# Patient Record
Sex: Female | Born: 1975 | Race: White | Hispanic: No | Smoking: Former smoker
Health system: Southern US, Community
[De-identification: ages and names within clinical notes are randomized; demographics above are authoritative.]

## PROBLEM LIST (undated history)

## (undated) DIAGNOSIS — N83209 Unspecified ovarian cyst, unspecified side: Secondary | ICD-10-CM

## (undated) DIAGNOSIS — S82409A Unspecified fracture of shaft of unspecified fibula, initial encounter for closed fracture: Secondary | ICD-10-CM

## (undated) DIAGNOSIS — R740 Nonspecific elevation of levels of transaminase and lactic acid dehydrogenase [LDH]: Secondary | ICD-10-CM

## (undated) DIAGNOSIS — I10 Essential (primary) hypertension: Secondary | ICD-10-CM

## (undated) DIAGNOSIS — F419 Anxiety disorder, unspecified: Secondary | ICD-10-CM

## (undated) DIAGNOSIS — Z720 Tobacco use: Secondary | ICD-10-CM

## (undated) DIAGNOSIS — F1910 Other psychoactive substance abuse, uncomplicated: Secondary | ICD-10-CM

## (undated) DIAGNOSIS — S060XAA Concussion with loss of consciousness status unknown, initial encounter: Secondary | ICD-10-CM

## (undated) DIAGNOSIS — S060X9A Concussion with loss of consciousness of unspecified duration, initial encounter: Secondary | ICD-10-CM

## (undated) DIAGNOSIS — R7401 Elevation of levels of liver transaminase levels: Secondary | ICD-10-CM

## (undated) HISTORY — DX: Anxiety disorder, unspecified: F41.9

## (undated) HISTORY — PX: LAPAROSCOPY: SHX197

## (undated) HISTORY — DX: Essential (primary) hypertension: I10

---

## 1998-07-25 ENCOUNTER — Emergency Department (HOSPITAL_COMMUNITY): Admission: EM | Admit: 1998-07-25 | Discharge: 1998-07-25 | Payer: Self-pay | Admitting: Emergency Medicine

## 1999-03-29 ENCOUNTER — Emergency Department (HOSPITAL_COMMUNITY): Admission: EM | Admit: 1999-03-29 | Discharge: 1999-03-29 | Payer: Self-pay | Admitting: Internal Medicine

## 2001-07-22 ENCOUNTER — Emergency Department (HOSPITAL_COMMUNITY): Admission: EM | Admit: 2001-07-22 | Discharge: 2001-07-23 | Payer: Self-pay | Admitting: Emergency Medicine

## 2001-07-23 ENCOUNTER — Encounter: Payer: Self-pay | Admitting: Emergency Medicine

## 2001-07-25 ENCOUNTER — Emergency Department (HOSPITAL_COMMUNITY): Admission: EM | Admit: 2001-07-25 | Discharge: 2001-07-26 | Payer: Self-pay | Admitting: Emergency Medicine

## 2001-08-16 ENCOUNTER — Emergency Department (HOSPITAL_COMMUNITY): Admission: EM | Admit: 2001-08-16 | Discharge: 2001-08-16 | Payer: Self-pay | Admitting: *Deleted

## 2005-12-28 ENCOUNTER — Ambulatory Visit (HOSPITAL_COMMUNITY): Admission: RE | Admit: 2005-12-28 | Discharge: 2005-12-28 | Payer: Self-pay | Admitting: Obstetrics & Gynecology

## 2006-01-10 ENCOUNTER — Ambulatory Visit (HOSPITAL_COMMUNITY): Payer: Self-pay | Admitting: Psychiatry

## 2006-01-18 ENCOUNTER — Ambulatory Visit: Payer: Self-pay | Admitting: Oncology

## 2006-02-07 ENCOUNTER — Ambulatory Visit (HOSPITAL_COMMUNITY): Payer: Self-pay | Admitting: Psychiatry

## 2006-03-16 ENCOUNTER — Ambulatory Visit: Payer: Self-pay | Admitting: Family Medicine

## 2006-03-16 LAB — CONVERTED CEMR LAB
BUN: 11 mg/dL (ref 6–23)
CO2: 26 meq/L (ref 19–32)
Calcium: 9.8 mg/dL (ref 8.4–10.5)
Chloride: 102 meq/L (ref 96–112)
Creatinine, Ser: 0.6 mg/dL (ref 0.4–1.2)
GFR calc non Af Amer: 126 mL/min
Glomerular Filtration Rate, Af Am: 152 mL/min/{1.73_m2}
Glucose, Bld: 88 mg/dL (ref 70–99)
Potassium: 4.2 meq/L (ref 3.5–5.1)
Sodium: 137 meq/L (ref 135–145)

## 2006-04-06 ENCOUNTER — Ambulatory Visit: Payer: Self-pay | Admitting: Internal Medicine

## 2006-05-02 ENCOUNTER — Ambulatory Visit: Payer: Self-pay | Admitting: Family Medicine

## 2006-07-27 ENCOUNTER — Ambulatory Visit: Payer: Self-pay | Admitting: Family Medicine

## 2006-08-23 ENCOUNTER — Ambulatory Visit: Payer: Self-pay | Admitting: Family Medicine

## 2006-09-23 ENCOUNTER — Emergency Department (HOSPITAL_COMMUNITY): Admission: EM | Admit: 2006-09-23 | Discharge: 2006-09-23 | Payer: Self-pay | Admitting: Emergency Medicine

## 2006-09-28 ENCOUNTER — Emergency Department (HOSPITAL_COMMUNITY): Admission: EM | Admit: 2006-09-28 | Discharge: 2006-09-28 | Payer: Self-pay | Admitting: Internal Medicine

## 2006-09-29 ENCOUNTER — Ambulatory Visit: Payer: Self-pay | Admitting: Family Medicine

## 2006-09-29 LAB — CONVERTED CEMR LAB
ALT: 67 units/L — ABNORMAL HIGH (ref 0–40)
AST: 51 units/L — ABNORMAL HIGH (ref 0–37)
Albumin: 4.3 g/dL (ref 3.5–5.2)
Alkaline Phosphatase: 51 units/L (ref 39–117)
Amphetamine Screen, Ur: NEGATIVE
BUN: 10 mg/dL (ref 6–23)
Barbiturate Quant, Ur: NEGATIVE
Basophils Absolute: 0.1 10*3/uL (ref 0.0–0.1)
Basophils Relative: 2.3 % — ABNORMAL HIGH (ref 0.0–1.0)
Benzodiazepines.: NEGATIVE
Bilirubin, Direct: 0.1 mg/dL (ref 0.0–0.3)
CO2: 30 meq/L (ref 19–32)
Calcium: 9.3 mg/dL (ref 8.4–10.5)
Chloride: 105 meq/L (ref 96–112)
Cocaine Metabolites: NEGATIVE
Creatinine, Ser: 0.5 mg/dL (ref 0.4–1.2)
Creatinine,U: 118 mg/dL
Eosinophils Absolute: 0.5 10*3/uL (ref 0.0–0.6)
Eosinophils Relative: 5.1 % — ABNORMAL HIGH (ref 0.0–5.0)
Folate: 8.9 ng/mL
GFR calc Af Amer: 188 mL/min
GFR calc non Af Amer: 155 mL/min
Glucose, Bld: 93 mg/dL (ref 70–99)
HCT: 37.9 % (ref 36.0–46.0)
Hemoglobin: 12.9 g/dL (ref 12.0–15.0)
Lymphocytes Relative: 13.1 % (ref 12.0–46.0)
MCHC: 34.1 g/dL (ref 30.0–36.0)
MCV: 103.7 fL — ABNORMAL HIGH (ref 78.0–100.0)
Marijuana Metabolite: NEGATIVE
Methadone: NEGATIVE
Monocytes Absolute: 0.3 10*3/uL (ref 0.2–0.7)
Monocytes Relative: 2.9 % — ABNORMAL LOW (ref 3.0–11.0)
Neutro Abs: 7.4 10*3/uL (ref 1.4–7.7)
Opiate Screen, Urine: NEGATIVE
Phencyclidine (PCP): NEGATIVE
Platelets: 331 10*3/uL (ref 150–400)
Potassium: 4 meq/L (ref 3.5–5.1)
Propoxyphene: NEGATIVE
RBC: 3.65 M/uL — ABNORMAL LOW (ref 3.87–5.11)
RDW: 15.5 % — ABNORMAL HIGH (ref 11.5–14.6)
Sodium: 140 meq/L (ref 135–145)
TSH: 0.36 microintl units/mL (ref 0.35–5.50)
Total Bilirubin: 0.5 mg/dL (ref 0.3–1.2)
Total Protein: 6.3 g/dL (ref 6.0–8.3)
Vitamin B-12: 542 pg/mL (ref 211–911)
WBC: 9.9 10*3/uL (ref 4.5–10.5)

## 2006-09-30 ENCOUNTER — Emergency Department (HOSPITAL_COMMUNITY): Admission: EM | Admit: 2006-09-30 | Discharge: 2006-09-30 | Payer: Self-pay | Admitting: *Deleted

## 2006-10-06 ENCOUNTER — Ambulatory Visit: Payer: Self-pay | Admitting: Cardiology

## 2007-01-19 ENCOUNTER — Ambulatory Visit: Payer: Self-pay | Admitting: Family Medicine

## 2007-01-19 DIAGNOSIS — H60509 Unspecified acute noninfective otitis externa, unspecified ear: Secondary | ICD-10-CM

## 2007-01-19 DIAGNOSIS — I10 Essential (primary) hypertension: Secondary | ICD-10-CM

## 2007-01-23 ENCOUNTER — Telehealth (INDEPENDENT_AMBULATORY_CARE_PROVIDER_SITE_OTHER): Payer: Self-pay | Admitting: *Deleted

## 2008-03-27 ENCOUNTER — Telehealth (INDEPENDENT_AMBULATORY_CARE_PROVIDER_SITE_OTHER): Payer: Self-pay | Admitting: *Deleted

## 2008-03-28 ENCOUNTER — Ambulatory Visit: Payer: Self-pay | Admitting: Family Medicine

## 2008-03-28 DIAGNOSIS — R300 Dysuria: Secondary | ICD-10-CM

## 2008-03-28 LAB — CONVERTED CEMR LAB
Bilirubin Urine: NEGATIVE
Blood in Urine, dipstick: NEGATIVE
Glucose, Urine, Semiquant: NEGATIVE
Ketones, urine, test strip: NEGATIVE
Nitrite: NEGATIVE
Specific Gravity, Urine: 1.01
Urobilinogen, UA: 0.2
WBC Urine, dipstick: NEGATIVE
pH: 6.5

## 2008-03-30 ENCOUNTER — Encounter: Payer: Self-pay | Admitting: Family Medicine

## 2008-04-01 ENCOUNTER — Telehealth (INDEPENDENT_AMBULATORY_CARE_PROVIDER_SITE_OTHER): Payer: Self-pay | Admitting: *Deleted

## 2008-04-02 ENCOUNTER — Ambulatory Visit: Payer: Self-pay | Admitting: Family Medicine

## 2008-04-02 LAB — CONVERTED CEMR LAB
Bilirubin Urine: NEGATIVE
Blood in Urine, dipstick: NEGATIVE
Glucose, Urine, Semiquant: NEGATIVE
Ketones, urine, test strip: NEGATIVE
Nitrite: NEGATIVE
Protein, U semiquant: NEGATIVE
Specific Gravity, Urine: 1.01
Urobilinogen, UA: 0.2
WBC Urine, dipstick: NEGATIVE
pH: 6.5

## 2008-04-03 ENCOUNTER — Encounter (INDEPENDENT_AMBULATORY_CARE_PROVIDER_SITE_OTHER): Payer: Self-pay | Admitting: *Deleted

## 2008-04-03 ENCOUNTER — Encounter: Payer: Self-pay | Admitting: Family Medicine

## 2008-04-03 LAB — CONVERTED CEMR LAB
Bilirubin Urine: NEGATIVE
Hemoglobin, Urine: NEGATIVE
Ketones, ur: NEGATIVE mg/dL
Leukocytes, UA: NEGATIVE
Nitrite: NEGATIVE
Protein, ur: NEGATIVE mg/dL
Specific Gravity, Urine: 1.027 (ref 1.005–1.03)
Urine Glucose: NEGATIVE mg/dL
Urobilinogen, UA: 0.2 (ref 0.0–1.0)
WBC, UA: NONE SEEN cells/hpf (ref <3)
pH: 6 (ref 5.0–8.0)

## 2008-04-09 ENCOUNTER — Encounter (INDEPENDENT_AMBULATORY_CARE_PROVIDER_SITE_OTHER): Payer: Self-pay | Admitting: *Deleted

## 2010-07-10 ENCOUNTER — Other Ambulatory Visit: Payer: Self-pay | Admitting: Family Medicine

## 2010-07-10 DIAGNOSIS — M545 Low back pain: Secondary | ICD-10-CM

## 2010-07-10 DIAGNOSIS — R202 Paresthesia of skin: Secondary | ICD-10-CM

## 2010-07-15 ENCOUNTER — Other Ambulatory Visit: Payer: Self-pay

## 2010-11-27 ENCOUNTER — Other Ambulatory Visit: Payer: Self-pay | Admitting: Family Medicine

## 2010-11-27 DIAGNOSIS — R1032 Left lower quadrant pain: Secondary | ICD-10-CM

## 2010-12-01 ENCOUNTER — Other Ambulatory Visit: Payer: Self-pay

## 2011-01-15 ENCOUNTER — Other Ambulatory Visit: Payer: Self-pay | Admitting: Family Medicine

## 2011-01-15 DIAGNOSIS — M545 Low back pain: Secondary | ICD-10-CM

## 2011-01-21 ENCOUNTER — Other Ambulatory Visit: Payer: Self-pay

## 2011-01-25 ENCOUNTER — Ambulatory Visit
Admission: RE | Admit: 2011-01-25 | Discharge: 2011-01-25 | Disposition: A | Discharge status: Home / Self Care | Payer: PRIVATE HEALTH INSURANCE | Source: Ambulatory Visit | Attending: Family Medicine | Admitting: Family Medicine

## 2011-01-25 DIAGNOSIS — M545 Low back pain, unspecified: Secondary | ICD-10-CM

## 2011-02-16 ENCOUNTER — Other Ambulatory Visit: Payer: Self-pay | Admitting: Neurological Surgery

## 2011-02-16 DIAGNOSIS — M545 Low back pain: Secondary | ICD-10-CM

## 2011-02-17 MED ORDER — DIAZEPAM 2 MG PO TABS: 10.0000 mg | ORAL_TABLET | Freq: Once | ORAL | Status: DC

## 2011-02-18 ENCOUNTER — Inpatient Hospital Stay
Admission: RE | Admit: 2011-02-18 | Discharge: 2011-02-18 | Payer: PRIVATE HEALTH INSURANCE | Source: Ambulatory Visit | Attending: Neurological Surgery | Admitting: Neurological Surgery

## 2011-02-18 ENCOUNTER — Other Ambulatory Visit: Payer: PRIVATE HEALTH INSURANCE

## 2011-02-18 DIAGNOSIS — M545 Low back pain: Secondary | ICD-10-CM

## 2011-08-04 ENCOUNTER — Ambulatory Visit: Payer: PRIVATE HEALTH INSURANCE

## 2011-08-04 ENCOUNTER — Ambulatory Visit (INDEPENDENT_AMBULATORY_CARE_PROVIDER_SITE_OTHER): Payer: PRIVATE HEALTH INSURANCE | Admitting: Family Medicine

## 2011-08-04 VITALS — BP 142/87 | HR 106 | Temp 98.0°F | Resp 16 | Ht 65.5 in | Wt 153.4 lb

## 2011-08-04 DIAGNOSIS — R079 Chest pain, unspecified: Secondary | ICD-10-CM

## 2011-08-04 DIAGNOSIS — R0781 Pleurodynia: Secondary | ICD-10-CM

## 2011-08-04 DIAGNOSIS — M25519 Pain in unspecified shoulder: Secondary | ICD-10-CM

## 2011-08-04 MED ORDER — CYCLOBENZAPRINE HCL 5 MG PO TABS: 5.0000 mg | ORAL_TABLET | Freq: Three times a day (TID) | ORAL | Status: AC | PRN

## 2011-08-04 MED ORDER — NAPROXEN 500 MG PO TABS: 500.0000 mg | ORAL_TABLET | Freq: Two times a day (BID) | ORAL | Status: DC

## 2011-08-04 NOTE — Patient Instructions (Signed)
Thank you for coming in today.  I appreciate your patience as we become more comfortable with our computer system.  Today you saw Ardeen Garland, MD. I hope you feel better quickly.

## 2011-08-04 NOTE — Progress Notes (Signed)
  Subjective:    Patient ID: Stephanie Montes, female    DOB: 31-Oct-1975, 36 y.o.   MRN: [id number]  HPI 36 yo female with shoulder pain following a fall yesterday morning. WAs carrying a bag and tripped and rolled and hit right shoulder and side on garage.  Hurts to move shoulder, take a deep breath.  Breathing okay just hurts to take a deep breath.  Pain with trying to sleep last night.  Tried tylenol and got no relief. No history of previous injury.   Review of Systems Negative except as per HPI     Objective:   Physical Exam  Constitutional: She appears well-developed and well-nourished.  Cardiovascular: Normal rate, regular rhythm, normal heart sounds and intact distal pulses.   No murmur heard. Pulmonary/Chest: Effort normal and breath sounds normal.  Neurological: She is alert.  Skin: Skin is warm and dry.   Right shoulder - TTP over deltoid.  Pain with abduction after about 85 degrees and with extension after about 95 degrees.  Difficulty reaching behind to opposite shoulder blade.  Full strength.  TTP over ribs at right lateral bra line.    [facility] Primary radiology reading by Dr. Georgiana Shore: No fracture or dislocation seen     Assessment & Plan:  Shoulder pain, rib pain Contusions only.  No fractures identified.  Naproxen and flexeril.  Heat and ice as needed.

## 2011-08-09 ENCOUNTER — Telehealth: Payer: Self-pay

## 2011-08-09 NOTE — Telephone Encounter (Signed)
Pt states her shoulder and ribs are worst than when she was seen last week Is taking flexiril and pain meds  Please call [phone]  [facility] [address]

## 2011-08-10 NOTE — Telephone Encounter (Signed)
Pt needs to RTC.

## 2011-08-11 NOTE — Telephone Encounter (Signed)
LMOM advising patient to RTC. 

## 2011-09-08 ENCOUNTER — Inpatient Hospital Stay (HOSPITAL_COMMUNITY): Payer: PRIVATE HEALTH INSURANCE

## 2011-09-08 ENCOUNTER — Inpatient Hospital Stay (HOSPITAL_COMMUNITY)
Admission: AD | Admit: 2011-09-08 | Discharge: 2011-09-08 | Disposition: A | Discharge status: Home / Self Care | Payer: PRIVATE HEALTH INSURANCE | Source: Ambulatory Visit | Attending: Family Medicine | Admitting: Family Medicine

## 2011-09-08 ENCOUNTER — Encounter (HOSPITAL_COMMUNITY): Payer: Self-pay | Admitting: *Deleted

## 2011-09-08 DIAGNOSIS — R1031 Right lower quadrant pain: Secondary | ICD-10-CM | POA: Insufficient documentation

## 2011-09-08 DIAGNOSIS — N390 Urinary tract infection, site not specified: Secondary | ICD-10-CM | POA: Insufficient documentation

## 2011-09-08 HISTORY — DX: Unspecified ovarian cyst, unspecified side: N83.209

## 2011-09-08 LAB — CBC
HCT: 38.8 % (ref 36.0–46.0)
MCHC: 33.5 g/dL (ref 30.0–36.0)
MCV: 87.6 fL (ref 78.0–100.0)
Platelets: 411 10*3/uL — ABNORMAL HIGH (ref 150–400)
RDW: 16.5 % — ABNORMAL HIGH (ref 11.5–15.5)
WBC: 12 10*3/uL — ABNORMAL HIGH (ref 4.0–10.5)

## 2011-09-08 LAB — DIFFERENTIAL
Basophils Absolute: 0 10*3/uL (ref 0.0–0.1)
Basophils Relative: 0 % (ref 0–1)
Eosinophils Relative: 4 % (ref 0–5)
Lymphocytes Relative: 25 % (ref 12–46)
Monocytes Absolute: 0.4 10*3/uL (ref 0.1–1.0)

## 2011-09-08 LAB — POCT PREGNANCY, URINE: Preg Test, Ur: NEGATIVE

## 2011-09-08 LAB — URINALYSIS, ROUTINE W REFLEX MICROSCOPIC
Bilirubin Urine: NEGATIVE
Ketones, ur: NEGATIVE mg/dL
Nitrite: POSITIVE — AB
pH: 5 (ref 5.0–8.0)

## 2011-09-08 LAB — URINE MICROSCOPIC-ADD ON

## 2011-09-08 MED ORDER — CIPROFLOXACIN HCL 500 MG PO TABS: 500.0000 mg | ORAL_TABLET | Freq: Two times a day (BID) | ORAL | Status: AC

## 2011-09-08 MED ORDER — PHENAZOPYRIDINE HCL 100 MG PO TABS
200.0000 mg | ORAL_TABLET | Freq: Once | ORAL | Status: AC
  Administered 2011-09-08: 200 mg via ORAL
  Filled 2011-09-08: qty 2

## 2011-09-08 MED ORDER — PHENAZOPYRIDINE HCL 200 MG PO TABS: 200.0000 mg | ORAL_TABLET | Freq: Three times a day (TID) | ORAL | Status: AC

## 2011-09-08 MED ORDER — KETOROLAC TROMETHAMINE 60 MG/2ML IM SOLN
60.0000 mg | Freq: Once | INTRAMUSCULAR | Status: AC
  Administered 2011-09-08: 60 mg via INTRAMUSCULAR
  Filled 2011-09-08: qty 2

## 2011-09-08 NOTE — MAU Note (Addendum)
Pt in c/o lower right sided abdominal pain since last night around 2300.  States pain is pretty constant and is sharp and cramping.  Hx of ovarian cysts.  States she started period last night, but is much darker and lighter than normal periods.  Took 1 hydrocodone last night, did not help.

## 2011-09-08 NOTE — MAU Provider Note (Signed)
History     CSN: [id number]  Arrival date and time: 09/08/11 1248   None     Chief Complaint  Patient presents with  . Abdominal Pain   HPI Patient seen for sharp, cramping right lower quadrant pain that started last night.  Has history of ovarian cysts, though this pain feels different.  Started period yesterday, though flow different - blackish in color.  Denies fevers, chills, nausea, vomiting, diarrhea, constipation.  Has tried naproxen and heat, which was minimally helpful.  Nothing makes it worse.  OB History    Grav Para Term Preterm Abortions TAB SAB Ect Mult Living   1 0 0 0 1 0 1 0 0 0       Past Medical History  Diagnosis Date  . Ovarian cyst     Past Surgical History  Procedure Date  . Laparoscopy     Family History  Problem Relation Age of Onset  . Anesthesia problems Neg Hx     History  Substance Use Topics  . Smoking status: Former Games developer  . Smokeless tobacco: Not on file  . Alcohol Use: No    Allergies: No Known Allergies  Prescriptions prior to admission  Medication Sig Dispense Refill  . naproxen (NAPROSYN) 500 MG tablet Take 1 tablet (500 mg total) by mouth 2 (two) times daily with a meal.  30 tablet  0    Review of Systems  Constitutional: Negative for fever and chills.  Gastrointestinal: Negative for nausea, vomiting, diarrhea, constipation and blood in stool.  Genitourinary: Negative for dysuria, urgency, frequency and hematuria.   Physical Exam   Blood pressure 160/91, pulse 96, temperature 97.8 F (36.6 C), temperature source Oral, resp. rate 20, height 5\' 4"  (1.626 m), weight 70.308 kg (155 lb), last menstrual period 09/07/2011.  Physical Exam  Constitutional: She appears well-developed and well-nourished.  HENT:  Head: Normocephalic and atraumatic.  GI: Soft. Bowel sounds are normal. She exhibits no distension and no mass. There is tenderness (right lower quadrant). There is no rebound and no guarding.  Genitourinary:   Right adnexal tenderness, no masses palpated.  Menstrual blood from os and in vaginal vault.  Musculoskeletal: She exhibits no edema.  Neurological: She is alert.  Skin: Skin is warm and dry. No rash noted. No erythema.  Psychiatric: She has a normal mood and affect. Her behavior is normal. Judgment and thought content normal.   Results for orders placed during the hospital encounter of 09/08/11 (from the past 24 hour(s))  URINALYSIS, ROUTINE W REFLEX MICROSCOPIC     Status: Abnormal   Collection Time   09/08/11 12:51 PM      Component Value Range   Color, Urine RED (*) YELLOW    APPearance CLOUDY (*) CLEAR    Specific Gravity, Urine 1.025  1.005 - 1.030    pH 5.0  5.0 - 8.0    Glucose, UA NEGATIVE  NEGATIVE (mg/dL)   Hgb urine dipstick LARGE (*) NEGATIVE    Bilirubin Urine NEGATIVE  NEGATIVE    Ketones, ur NEGATIVE  NEGATIVE (mg/dL)   Protein, ur 295 (*) NEGATIVE (mg/dL)   Urobilinogen, UA 1.0  0.0 - 1.0 (mg/dL)   Nitrite POSITIVE (*) NEGATIVE    Leukocytes, UA SMALL (*) NEGATIVE   URINE MICROSCOPIC-ADD ON     Status: Abnormal   Collection Time   09/08/11 12:51 PM      Component Value Range   Squamous Epithelial / LPF FEW (*) RARE    WBC, UA 3-6  <  3 (WBC/hpf)   RBC / HPF TOO NUMEROUS TO COUNT  <3 (RBC/hpf)   Bacteria, UA MANY (*) RARE   POCT PREGNANCY, URINE     Status: Normal   Collection Time   09/08/11  1:08 PM      Component Value Range   Preg Test, Ur NEGATIVE  NEGATIVE   WET PREP, GENITAL     Status: Abnormal   Collection Time   09/08/11  1:20 PM      Component Value Range   Yeast Wet Prep HPF POC NONE SEEN  NONE SEEN    Trich, Wet Prep NONE SEEN  NONE SEEN    Clue Cells Wet Prep HPF POC NONE SEEN  NONE SEEN    WBC, Wet Prep HPF POC FEW (*) NONE SEEN   CBC     Status: Abnormal   Collection Time   09/08/11  1:26 PM      Component Value Range   WBC 12.0 (*) 4.0 - 10.5 (K/uL)   RBC 4.43  3.87 - 5.11 (MIL/uL)   Hemoglobin 13.0  12.0 - 15.0 (g/dL)   HCT 19.1  47.8 -  29.5 (%)   MCV 87.6  78.0 - 100.0 (fL)   MCH 29.3  26.0 - 34.0 (pg)   MCHC 33.5  30.0 - 36.0 (g/dL)   RDW 62.1 (*) 30.8 - 15.5 (%)   Platelets 411 (*) 150 - 400 (K/uL)  DIFFERENTIAL     Status: Abnormal   Collection Time   09/08/11  1:26 PM      Component Value Range   Neutrophils Relative 67  43 - 77 (%)   Neutro Abs 8.1 (*) 1.7 - 7.7 (K/uL)   Lymphocytes Relative 25  12 - 46 (%)   Lymphs Abs 3.0  0.7 - 4.0 (K/uL)   Monocytes Relative 4  3 - 12 (%)   Monocytes Absolute 0.4  0.1 - 1.0 (K/uL)   Eosinophils Relative 4  0 - 5 (%)   Eosinophils Absolute 0.5  0.0 - 0.7 (K/uL)   Basophils Relative 0  0 - 1 (%)   Basophils Absolute 0.0  0.0 - 0.1 (K/uL)     MAU Course  Procedures  MDM No evidence of ovarian torsion or ectopic pregnancy on ultrasound.  Negative urine pregnancy test.  Less likely to be appendicitis with WBC at 12K.   Assessment and Plan  1.  UTI Will treat with Ciprofloxacin and Pyridium.  Patient to return or see PCP if pain worsens or does not improve.  Stephanie Montes JEHIEL 09/08/2011, 1:09 PM

## 2011-09-08 NOTE — Discharge Instructions (Signed)

## 2011-09-09 LAB — GC/CHLAMYDIA PROBE AMP, GENITAL: GC Probe Amp, Genital: NEGATIVE

## 2012-07-01 HISTORY — PX: DENTAL SURGERY: SHX609

## 2012-08-18 ENCOUNTER — Other Ambulatory Visit: Payer: Self-pay | Admitting: Family Medicine

## 2012-08-21 ENCOUNTER — Other Ambulatory Visit: Payer: Self-pay | Admitting: Family Medicine

## 2012-11-24 ENCOUNTER — Ambulatory Visit: Payer: PRIVATE HEALTH INSURANCE | Admitting: Family

## 2012-12-19 ENCOUNTER — Ambulatory Visit (INDEPENDENT_AMBULATORY_CARE_PROVIDER_SITE_OTHER): Payer: BC Managed Care – PPO | Admitting: Family

## 2012-12-19 ENCOUNTER — Encounter: Payer: Self-pay | Admitting: Family

## 2012-12-19 VITALS — BP 118/80 | HR 90 | Temp 98.4°F | Resp 16 | Ht 65.0 in | Wt 143.0 lb

## 2012-12-19 DIAGNOSIS — I10 Essential (primary) hypertension: Secondary | ICD-10-CM

## 2012-12-19 DIAGNOSIS — E559 Vitamin D deficiency, unspecified: Secondary | ICD-10-CM

## 2012-12-19 DIAGNOSIS — F411 Generalized anxiety disorder: Secondary | ICD-10-CM

## 2012-12-19 DIAGNOSIS — Z8639 Personal history of other endocrine, nutritional and metabolic disease: Secondary | ICD-10-CM

## 2012-12-19 DIAGNOSIS — Z0000 Encounter for general adult medical examination without abnormal findings: Secondary | ICD-10-CM

## 2012-12-19 MED ORDER — AMLODIPINE BESYLATE 5 MG PO TABS: 5.0000 mg | ORAL_TABLET | Freq: Every day | ORAL | Status: DC

## 2012-12-19 NOTE — Patient Instructions (Addendum)
Please schedule a fasting physical in 1 month. Stop lisinopril, start amlodipine.  Welcome to [facility]!

## 2012-12-19 NOTE — Progress Notes (Signed)
Subjective:    Patient ID: Stephanie Montes, female    DOB: 1975-09-10, 37 y.o.   MRN: [id number]  HPI  Stephanie Montes is a 37 yr old female who presents today to establish care.  She has been following with Dr. Ivory Broad in [place].    HTN- pt is maintained on hctz.  Anxiety- pt reports that this is fairly well controlled. She reports that she uses xanax tid. She reports that she has been on xanax since January of this year. She has been on lexapro since March of this year.  She denies current issues with depression.  Reports feeling overwhelmed.  Notes a lot of stress.  Denies SI or hopelessness.   Vitamin D deficiency- she is not currently on vitamin D.  Back pain- she reports that she does have an rx for hydrocodone at home, but she is not currently using.    Review of Systems See HPI  Past Medical History  Diagnosis Date  . Ovarian cyst   . Hypertension   . Anxiety     History   Social History  . Marital Status: Married    Spouse Name: N/A    Number of Children: N/A  . Years of Education: N/A   Occupational History  . Not on file.   Social History Main Topics  . Smoking status: Current Every Day Smoker  . Smokeless tobacco: Never Used     Comment: 8-9 cigareetes daily  . Alcohol Use: No  . Drug Use: No  . Sexually Active: Yes    Birth Control/ Protection: Condom   Other Topics Concern  . Not on file   Social History Narrative   Married, 10 years   Has a step son who is 59- she has raised him   Not currently working- wants to find a job   She is working on an Tourist information centre manager at [employer]. Husband is Scientist, research (medical) at [employer]   Has a sister who lives in [place]. Does not have a great relationship with her mom.   Enjoys reading/puzzles.      Past Surgical History  Procedure Laterality Date  . Laparoscopy    . Dental surgery  07/2012    tooth implant    Family History  Problem Relation Age of Onset  . Anesthesia problems Neg Hx   . Cancer Mother 10   breast  . Osteoporosis Mother   . Cancer Sister 57    breast  . Stroke Father   . Hypertension Father   . Aneurysm Father     brain  . Cancer Paternal Aunt     breast  . Cancer Maternal Grandfather     ?bladder  . Cancer Cousin 42    breast  . Cancer Maternal Aunt     ?brain tumor  . Cancer Paternal Aunt     ovarian  . Cancer Cousin     brain tumors  . Alzheimer's disease Paternal Uncle     No Known Allergies  Current Outpatient Prescriptions on File Prior to Visit  Medication Sig Dispense Refill  . Cyanocobalamin (VITAMIN B 12 PO) Take 1 tablet by mouth daily.      . Vitamin D, Ergocalciferol, (DRISDOL) 50000 UNITS CAPS Take 50,000 Units by mouth.       No current facility-administered medications on file prior to visit.    BP 118/80  Pulse 90  Temp(Src) 98.4 F (36.9 C) (Oral)  Resp 16  Ht 5\' 5"  (1.651 m)  Wt 143  lb (64.864 kg)  BMI 23.8 kg/m2  SpO2 96%  LMP 12/05/2012       Objective:   Physical Exam  Constitutional: She is oriented to person, place, and time. She appears well-developed and well-nourished. No distress.  HENT:  Head: Normocephalic and atraumatic.  Cardiovascular: Normal rate and regular rhythm.   No murmur heard. Pulmonary/Chest: Effort normal. No respiratory distress. She has no wheezes. She has no rales. She exhibits no tenderness.  Musculoskeletal: She exhibits no edema.  Neurological: She is alert and oriented to person, place, and time.  Skin: Skin is warm and dry.  Psychiatric: She has a normal mood and affect. Her behavior is normal. Judgment and thought content normal.          Assessment & Plan:

## 2012-12-20 ENCOUNTER — Encounter: Payer: Self-pay | Admitting: Family

## 2012-12-20 LAB — BASIC METABOLIC PANEL
Calcium: 9.3 mg/dL (ref 8.4–10.5)
Potassium: 3.8 mEq/L (ref 3.5–5.3)
Sodium: 135 mEq/L (ref 135–145)

## 2012-12-20 LAB — VITAMIN D 25 HYDROXY (VIT D DEFICIENCY, FRACTURES): Vit D, 25-Hydroxy: 59 ng/mL (ref 30–89)

## 2012-12-21 DIAGNOSIS — F411 Generalized anxiety disorder: Secondary | ICD-10-CM | POA: Insufficient documentation

## 2012-12-21 DIAGNOSIS — Z8639 Personal history of other endocrine, nutritional and metabolic disease: Secondary | ICD-10-CM | POA: Insufficient documentation

## 2012-12-21 NOTE — Assessment & Plan Note (Addendum)
BP Readings from Last 3 Encounters:  12/19/12 118/80  09/08/11 143/86  08/04/11 142/87   BP is stable on hctz and lisinopril.  Will change lisinopril to amlodipine as pt is of childbearing age. Obtain bmet.

## 2012-12-21 NOTE — Assessment & Plan Note (Signed)
Stable on prn xanax and lexapro. Reminded pt not to become pregnant while on xanax. 25 minutes spent with pt today.  >50% of this time was spent counseling pt on anxiety, HTN.

## 2012-12-21 NOTE — Assessment & Plan Note (Signed)
Obtain vitamin D level. She is not currently on supplement.

## 2012-12-22 ENCOUNTER — Ambulatory Visit: Payer: BC Managed Care – PPO | Admitting: Family

## 2012-12-26 ENCOUNTER — Ambulatory Visit: Payer: BC Managed Care – PPO | Admitting: Family

## 2013-01-02 ENCOUNTER — Other Ambulatory Visit: Payer: Self-pay | Admitting: Family

## 2013-01-02 NOTE — Telephone Encounter (Signed)
Patient states that she is using [facility] new [facility] [address]. She would like refills of lisinopril and zanax to be sent there.

## 2013-01-03 ENCOUNTER — Encounter: Payer: Self-pay | Admitting: Family

## 2013-01-03 ENCOUNTER — Other Ambulatory Visit: Payer: Self-pay | Admitting: *Deleted

## 2013-01-03 MED ORDER — ALPRAZOLAM 0.5 MG PO TABS: 0.5000 mg | ORAL_TABLET | Freq: Three times a day (TID) | ORAL | Status: DC | PRN

## 2013-01-03 NOTE — Telephone Encounter (Signed)
We stopped lisinopril at her appointment and changed to amlodipine.  She should be evaluated for a 1 month follow up of her BP with this change. She should still be on hctz- ok to send refill of this if she needs. I will refill her xanax, but she needs to fill out a controlled substance contract.  Will leave contract at the front desk and she can pick up rx when she signs.

## 2013-01-03 NOTE — Telephone Encounter (Signed)
Refill request for Xanax, lisinpril Last filled by MD on - never filled Last seen- 12/19/12 F/U appt: 01/12/13 Please advise refill?

## 2013-01-04 NOTE — Telephone Encounter (Signed)
Notified pt, she is aware that lisinopril was stopped and is not currently taking this medication. States she was trying to request escitalopram and will come by today around 2:30pm to sign contract and pick up Rx.  Please advise re: escitalopram refill.

## 2013-01-05 NOTE — Telephone Encounter (Signed)
Ok to refill lexapro #30 with 2 refills.

## 2013-01-12 ENCOUNTER — Encounter: Payer: BC Managed Care – PPO | Admitting: Family

## 2013-01-17 ENCOUNTER — Encounter: Payer: BC Managed Care – PPO | Admitting: Family

## 2013-01-23 ENCOUNTER — Ambulatory Visit (INDEPENDENT_AMBULATORY_CARE_PROVIDER_SITE_OTHER): Payer: BC Managed Care – PPO | Admitting: Family

## 2013-01-23 ENCOUNTER — Other Ambulatory Visit: Payer: Self-pay | Admitting: Family

## 2013-01-23 ENCOUNTER — Encounter: Payer: Self-pay | Admitting: Family

## 2013-01-23 VITALS — BP 130/88 | HR 98 | Temp 98.1°F | Resp 16 | Wt 143.0 lb

## 2013-01-23 DIAGNOSIS — Z0000 Encounter for general adult medical examination without abnormal findings: Secondary | ICD-10-CM | POA: Insufficient documentation

## 2013-01-23 DIAGNOSIS — I10 Essential (primary) hypertension: Secondary | ICD-10-CM

## 2013-01-23 DIAGNOSIS — B370 Candidal stomatitis: Secondary | ICD-10-CM | POA: Insufficient documentation

## 2013-01-23 DIAGNOSIS — F411 Generalized anxiety disorder: Secondary | ICD-10-CM

## 2013-01-23 LAB — CBC WITH DIFFERENTIAL/PLATELET
Basophils Absolute: 0 10*3/uL (ref 0.0–0.1)
HCT: 37.6 % (ref 36.0–46.0)
Lymphocytes Relative: 19 % (ref 12–46)
Lymphs Abs: 2.4 10*3/uL (ref 0.7–4.0)
Monocytes Absolute: 0.7 10*3/uL (ref 0.1–1.0)
Neutro Abs: 9 10*3/uL — ABNORMAL HIGH (ref 1.7–7.7)
RBC: 4.34 MIL/uL (ref 3.87–5.11)
RDW: 18.9 % — ABNORMAL HIGH (ref 11.5–15.5)
WBC: 12.6 10*3/uL — ABNORMAL HIGH (ref 4.0–10.5)

## 2013-01-23 LAB — LIPID PANEL
Cholesterol: 199 mg/dL (ref 0–200)
Total CHOL/HDL Ratio: 5.1 Ratio
Triglycerides: 152 mg/dL — ABNORMAL HIGH (ref <150)

## 2013-01-23 LAB — BASIC METABOLIC PANEL WITH GFR
CO2: 26 mEq/L (ref 19–32)
Chloride: 101 mEq/L (ref 96–112)
Creat: 0.44 mg/dL — ABNORMAL LOW (ref 0.50–1.10)
GFR, Est Non African American: >89 mL/min
Potassium: 3.9 mEq/L (ref 3.5–5.3)
Sodium: 138 mEq/L (ref 135–145)

## 2013-01-23 MED ORDER — ESCITALOPRAM OXALATE 10 MG PO TABS: 20.0000 mg | ORAL_TABLET | Freq: Every day | ORAL | Status: DC

## 2013-01-23 MED ORDER — NYSTATIN 100000 UNIT/ML MT SUSP: 500000.0000 [IU] | Freq: Four times a day (QID) | OROMUCOSAL | Status: DC

## 2013-01-23 MED ORDER — HYDROCHLOROTHIAZIDE 25 MG PO TABS: 25.0000 mg | ORAL_TABLET | Freq: Every day | ORAL | Status: DC

## 2013-01-23 MED ORDER — ESCITALOPRAM OXALATE 10 MG PO TABS: 10.0000 mg | ORAL_TABLET | Freq: Every day | ORAL | Status: DC

## 2013-01-23 NOTE — Patient Instructions (Addendum)
Please complete lab work prior to leaving. Increase escitalopram to 20mg  once daily. Follow up in 6 weeks.

## 2013-01-23 NOTE — Assessment & Plan Note (Signed)
She believes that tetanus is up to date.  Continue healthy diet.  Obtain fasting lab work.

## 2013-01-23 NOTE — Progress Notes (Signed)
  Subjective:    Patient ID: Stephanie Montes, female    DOB: 09/11/1975, 37 y.o.   MRN: [id number]  HPI    Review of Systems     Objective:   Physical Exam        Assessment & Plan:

## 2013-01-23 NOTE — Assessment & Plan Note (Signed)
New, trial of nystatin.  Offered HIV screen today- pt declined.

## 2013-01-23 NOTE — Assessment & Plan Note (Signed)
bp is stable with transition to amlodipine. Continue same.

## 2013-01-23 NOTE — Progress Notes (Addendum)
Subjective:    Patient ID: Stephanie Montes, female    DOB: 1976-03-10, 37 y.o.   MRN: [id number]  HPI  Stephanie Montes is a 37 yr old female who presents today for cpx.  Immunizations: unsure of last tetanus.  Believes it is <10 yrs ago. Diet: reports trying to eat healthy. Exercise: active but no formal exercise Pap Smear: up to date Mammogram: completed 2008- normal per pt. Plan to repeat at age 30  She does report that her anxiety has recently worsened.  Her husband threw out her medications including her xanax.  She reports that she has 6 additional tabs in another bag and that she is trying to use them sparingly. Reports increased stress as she has recently returned to school and is in the process of trying to purchase a home.  Her step son is having behavioral problems and she has been arguing with her husband.     Review of Systems  Constitutional: Negative for unexpected weight change.  HENT: Negative for hearing loss and congestion.   Eyes: Negative for visual disturbance.  Respiratory: Negative for cough.   Cardiovascular: Negative for chest pain.       Notes she had some left foot swelling over the weekend which resolved with "fluid retention pill" that the pharmacist gave her  Gastrointestinal: Negative for nausea, vomiting and diarrhea.  Genitourinary: Negative for dysuria and frequency.  Musculoskeletal: Negative for myalgias and back pain.       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  Neurological: Negative for headaches.  Hematological: Negative for adenopathy.  Psychiatric/Behavioral:       + anxiety, denies depression   Past Medical History  Diagnosis Date  . Ovarian cyst   . Hypertension   . Anxiety     History   Social History  . Marital Status: Married    Spouse Name: N/A    Number of Children: N/A  . Years of Education: N/A   Occupational History  . Not on file.   Social  History Main Topics  . Smoking status: Current Every Day Smoker  . Smokeless tobacco: Never Used     Comment: 8-9 cigareetes daily  . Alcohol Use: No  . Drug Use: No  . Sexual Activity: Yes    Birth Control/ Protection: Condom   Other Topics Concern  . Not on file   Social History Narrative   Married, 10 years   Has a step son who is 89- she has raised him   Not currently working- wants to find a job   She is working on an Tourist information centre manager at [employer]. Husband is Scientist, research (medical) at [employer]   Has a sister who lives in [place]. Does not have a great relationship with her mom.   Enjoys reading/puzzles.      Past Surgical History  Procedure Laterality Date  . Laparoscopy    . Dental surgery  07/2012    tooth implant    Family History  Problem Relation Age of Onset  . Anesthesia problems Neg Hx   . Cancer Mother 61    breast  . Osteoporosis Mother   . Cancer Sister 74    breast  . Stroke Father   . Hypertension Father   . Aneurysm Father     brain  . Cancer Paternal Aunt     breast  . Cancer Maternal Grandfather     ?  bladder  . Cancer Cousin 42    breast  . Cancer Maternal Aunt     ?brain tumor  . Cancer Paternal Aunt     ovarian  . Cancer Cousin     brain tumors  . Alzheimer's disease Paternal Uncle     No Known Allergies  Current Outpatient Prescriptions on File Prior to Visit  Medication Sig Dispense Refill  . ALPRAZolam (XANAX) 0.5 MG tablet Take 1 tablet (0.5 mg total) by mouth 3 (three) times daily as needed.  90 tablet  0  . amLODipine (NORVASC) 5 MG tablet Take 1 tablet (5 mg total) by mouth daily.  30 tablet  2  . Vitamin D, Ergocalciferol, (DRISDOL) 50000 UNITS CAPS Take 50,000 Units by mouth.       No current facility-administered medications on file prior to visit.    BP 130/88  Pulse 98  Temp(Src) 98.1 F (36.7 C) (Oral)  Resp 16  Wt 143 lb (64.864 kg)  BMI 23.8 kg/m2  SpO2 99%  LMP 01/02/2013       Objective:   Physical Exam  HENT:   White patches noted on oropharynx    Physical Exam  Constitutional: She is oriented to person, place, and time. She appears well-developed and well-nourished. No distress.  HENT:  Head: Normocephalic and atraumatic.  Right Ear: Tympanic membrane and ear canal normal.  Left Ear: Tympanic membrane and ear canal normal.  Mouth/Throat: Oropharynx is clear and moist.  Eyes: Pupils are equal, round, and reactive to light. No scleral icterus.  Neck: Normal range of motion. No thyromegaly present.  Cardiovascular: Normal rate and regular rhythm.   No murmur heard. Pulmonary/Chest: Effort normal and breath sounds normal. No respiratory distress. He has no wheezes. She has no rales. She exhibits no tenderness.  Abdominal: Soft. Bowel sounds are normal. He exhibits no distension and no mass. There is no tenderness. There is no rebound and no guarding.  Musculoskeletal: She exhibits no edema.  Lymphadenopathy:    She has no cervical adenopathy.  Neurological: She is alert and oriented to person, place, and time.  She exhibits normal muscle tone. Coordination normal.  Skin: Skin is warm and dry.  Psychiatric: She has a normal mood and affect. Her behavior is normal. Judgment and thought content normal.  Breasts: Examined lying Right: Without masses, retractions, discharge or axillary adenopathy.  Left: Without masses, retractions, discharge or axillary adenopathy.  Pelvic: deferred to GYN          Assessment & Plan:         Assessment & Plan:

## 2013-01-23 NOTE — Assessment & Plan Note (Signed)
Deteriorated.  Increase escitalopram from 10 to 20 mg.  Advised pt, no early refills on her alprazolam.  Follow up in 6 weeks.

## 2013-01-24 LAB — URINALYSIS, ROUTINE W REFLEX MICROSCOPIC
Bilirubin Urine: NEGATIVE
Glucose, UA: NEGATIVE mg/dL
Leukocytes, UA: NEGATIVE
pH: 6.5 (ref 5.0–8.0)

## 2013-01-24 LAB — HEMOGLOBIN A1C: Mean Plasma Glucose: 128 mg/dL — ABNORMAL HIGH (ref <117)

## 2013-01-25 ENCOUNTER — Encounter: Payer: Self-pay | Admitting: Family

## 2013-01-25 DIAGNOSIS — R7303 Prediabetes: Secondary | ICD-10-CM | POA: Insufficient documentation

## 2013-01-25 DIAGNOSIS — D72829 Elevated white blood cell count, unspecified: Secondary | ICD-10-CM | POA: Insufficient documentation

## 2013-01-25 LAB — T4, FREE: Free T4: 1.13 ng/dL (ref 0.80–1.80)

## 2013-01-25 NOTE — Progress Notes (Signed)
  Subjective:    Patient ID: Stephanie Montes, female    DOB: Apr 09, 1976, 37 y.o.   MRN: [id number]  HPI    Review of Systems     Objective:   Physical Exam        Assessment & Plan:  Labs reveal borderline DM a1c 6.1. Leukocytosis, mild hyperthyroid. Plan repeat tft and cbc at her 1 month follow up.

## 2013-01-30 ENCOUNTER — Telehealth: Payer: Self-pay | Admitting: *Deleted

## 2013-01-30 NOTE — Telephone Encounter (Signed)
Faxed refill request received from pharmacy for Xanax early refill Last filled by MD on 08.06.14, #[id number] Per pharmacy, last filled on 08.07.14 Last AEX - 08.26.14 Please Advise/SLS

## 2013-01-30 NOTE — Telephone Encounter (Signed)
Rx cannot be refilled prior to 9/4.

## 2013-01-31 NOTE — Telephone Encounter (Signed)
Notified pt and she voices understanding. 

## 2013-02-01 MED ORDER — ALPRAZOLAM 0.5 MG PO TABS: 0.5000 mg | ORAL_TABLET | Freq: Three times a day (TID) | ORAL | Status: DC | PRN

## 2013-02-01 NOTE — Telephone Encounter (Signed)
Refill left on pharmacy voicemail, #[phone] x no refills.

## 2013-02-05 ENCOUNTER — Ambulatory Visit (INDEPENDENT_AMBULATORY_CARE_PROVIDER_SITE_OTHER): Payer: BC Managed Care – PPO | Admitting: Family

## 2013-02-05 ENCOUNTER — Ambulatory Visit (HOSPITAL_BASED_OUTPATIENT_CLINIC_OR_DEPARTMENT_OTHER)
Admission: RE | Admit: 2013-02-05 | Discharge: 2013-02-05 | Disposition: A | Discharge status: Home / Self Care | Payer: BC Managed Care – PPO | Source: Ambulatory Visit | Attending: Family | Admitting: Family

## 2013-02-05 ENCOUNTER — Encounter: Payer: Self-pay | Admitting: Family

## 2013-02-05 ENCOUNTER — Ambulatory Visit: Payer: BC Managed Care – PPO | Admitting: Family

## 2013-02-05 VITALS — BP 149/88 | HR 88 | Temp 98.4°F | Resp 16 | Wt 144.1 lb

## 2013-02-05 DIAGNOSIS — E0590 Thyrotoxicosis, unspecified without thyrotoxic crisis or storm: Secondary | ICD-10-CM

## 2013-02-05 DIAGNOSIS — H6692 Otitis media, unspecified, left ear: Secondary | ICD-10-CM

## 2013-02-05 DIAGNOSIS — Z8489 Family history of other specified conditions: Secondary | ICD-10-CM | POA: Insufficient documentation

## 2013-02-05 DIAGNOSIS — E041 Nontoxic single thyroid nodule: Secondary | ICD-10-CM | POA: Insufficient documentation

## 2013-02-05 DIAGNOSIS — H6690 Otitis media, unspecified, unspecified ear: Secondary | ICD-10-CM

## 2013-02-05 MED ORDER — AMOXICILLIN 500 MG PO CAPS: 500.0000 mg | ORAL_CAPSULE | Freq: Three times a day (TID) | ORAL | Status: DC

## 2013-02-05 NOTE — Assessment & Plan Note (Signed)
Obtain US thyroid and refer to endocrinology. This may be contributing to her anxiety symptoms.

## 2013-02-05 NOTE — Progress Notes (Signed)
Subjective:    Patient ID: Stephanie Montes, female    DOB: August 24, 1975, 37 y.o.   MRN: [id number]  HPI  Stephanie Montes is a 37 yr old female who presents today for follow up of her lab results.  She was noted last visit to have a mildly depressed TSH with a normal free T3, free T4.  She reports ongoing nervousness.  Mother and grandmother both had history of graves disease.   She reports nasal congestion x 2 days.  Has HA, fatigue, mild cough.  She does not mild sore throat.  And a swollen gland on the right side of her neck.    Review of Systems See HPI  Past Medical History  Diagnosis Date  . Ovarian cyst   . Hypertension   . Anxiety     History   Social History  . Marital Status: Married    Spouse Name: N/A    Number of Children: N/A  . Years of Education: N/A   Occupational History  . Not on file.   Social History Main Topics  . Smoking status: Current Every Day Smoker  . Smokeless tobacco: Never Used     Comment: 8-9 cigareetes daily  . Alcohol Use: No  . Drug Use: No  . Sexual Activity: Yes    Birth Control/ Protection: Condom   Other Topics Concern  . Not on file   Social History Narrative   Married, 10 years   Has a step son who is 27- she has raised him   Not currently working- wants to find a job   She is working on an Tourist information centre manager at [employer]. Husband is Scientist, research (medical) at [employer]   Has a sister who lives in [place]. Does not have a great relationship with her mom.   Enjoys reading/puzzles.      Past Surgical History  Procedure Laterality Date  . Laparoscopy    . Dental surgery  07/2012    tooth implant    Family History  Problem Relation Age of Onset  . Anesthesia problems Neg Hx   . Cancer Mother 63    breast  . Osteoporosis Mother   . Thyroid disease Mother     hypothyroidism / grave's disease  . Cancer Sister 38    breast  . Stroke Father   . Hypertension Father   . Aneurysm Father     brain  . Cancer Paternal Aunt     breast  .  Cancer Maternal Grandfather     ?bladder  . Cancer Cousin 42    breast  . Cancer Maternal Aunt     ?brain tumor  . Cancer Paternal Aunt     ovarian  . Cancer Cousin     brain tumors  . Alzheimer's disease Paternal Uncle   . Thyroid disease Maternal Grandmother     grave's disease    No Known Allergies  Current Outpatient Prescriptions on File Prior to Visit  Medication Sig Dispense Refill  . ALPRAZolam (XANAX) 0.5 MG tablet Take 1 tablet (0.5 mg total) by mouth 3 (three) times daily as needed.  90 tablet  0  . amLODipine (NORVASC) 5 MG tablet Take 1 tablet (5 mg total) by mouth daily.  30 tablet  2  . escitalopram (LEXAPRO) 10 MG tablet Take 2 tablets (20 mg total) by mouth daily.  60 tablet  2  . hydrochlorothiazide (HYDRODIURIL) 25 MG tablet Take 1 tablet (25 mg total) by mouth daily.  30 tablet  2  . nystatin (MYCOSTATIN) 100000 UNIT/ML suspension Take 5 mLs (500,000 Units total) by mouth 4 (four) times daily.  180 mL  0  . Vitamin D, Ergocalciferol, (DRISDOL) 50000 UNITS CAPS Take 50,000 Units by mouth.       No current facility-administered medications on file prior to visit.    BP 149/88  Pulse 88  Temp(Src) 98.4 F (36.9 C) (Oral)  Resp 16  Wt 144 lb 1.9 oz (65.372 kg)  BMI 23.98 kg/m2  SpO2 99%  LMP 01/02/2013       Objective:   Physical Exam  Constitutional: She is oriented to person, place, and time. She appears well-developed and well-nourished. No distress.  HENT:  Head: Normocephalic and atraumatic.  Right Ear: Tympanic membrane is erythematous.  Left Ear: Tympanic membrane is not erythematous.  Mouth/Throat: No oropharyngeal exudate, posterior oropharyngeal edema or posterior oropharyngeal erythema.  Neck:  ? Mild right thyroid fullness  Cardiovascular: Normal rate and regular rhythm.   No murmur heard. Pulmonary/Chest: Effort normal and breath sounds normal. No respiratory distress. She has no wheezes. She has no rales. She exhibits no tenderness.   Musculoskeletal: She exhibits no edema.  Neurological: She is alert and oriented to person, place, and time.  Psychiatric: She has a normal mood and affect. Her behavior is normal. Judgment and thought content normal.          Assessment & Plan:  BP up today, ? Secondary to recent use of cold medication. Plan to repeat next visit in October.  If still elevated may need to increase BP meds.

## 2013-02-05 NOTE — Assessment & Plan Note (Signed)
Will rx with amoxicillin.  

## 2013-02-05 NOTE — Patient Instructions (Addendum)
Please schedule your neck ultrasound on the first floor. You will be contacted about your referral to Endocrinology.  Please let us know if you have not heard back within 1 week about your referral. Start amoxicillin for your ear infection. Call if symptoms worsen or if you are not feeling better in 2-3 days.

## 2013-02-08 ENCOUNTER — Telehealth: Payer: Self-pay | Admitting: *Deleted

## 2013-02-08 NOTE — Telephone Encounter (Signed)
Notified pt of thyroid u/s results. Pt wanted to know what she should do about the swelling in her glands and neck pain? Also states that her ear pain seems worse, feels deep. Has been taking amoxicillin x 3 days. Please advise.

## 2013-02-09 MED ORDER — CEFUROXIME AXETIL 500 MG PO TABS: 500.0000 mg | ORAL_TABLET | Freq: Two times a day (BID) | ORAL | Status: DC

## 2013-02-09 NOTE — Telephone Encounter (Signed)
Returned patient's call but there was no answer, will try again later.

## 2013-02-09 NOTE — Telephone Encounter (Signed)
Left message on voicemail to return my call.  

## 2013-02-09 NOTE — Telephone Encounter (Signed)
I would recommend that she take ibuprofen as needed for pain. Stop amoxicillin, start ceftin- If not improvement by Monday let me know.

## 2013-02-12 NOTE — Telephone Encounter (Signed)
Pt was notified on 02/09/13.

## 2013-02-14 ENCOUNTER — Encounter: Payer: Self-pay | Admitting: Endocrinology

## 2013-02-14 ENCOUNTER — Ambulatory Visit (INDEPENDENT_AMBULATORY_CARE_PROVIDER_SITE_OTHER): Payer: BC Managed Care – PPO | Admitting: Endocrinology

## 2013-02-14 VITALS — BP 128/70 | HR 90 | Ht 64.0 in | Wt 141.0 lb

## 2013-02-14 DIAGNOSIS — E0590 Thyrotoxicosis, unspecified without thyrotoxic crisis or storm: Secondary | ICD-10-CM

## 2013-02-14 NOTE — Progress Notes (Signed)
Subjective:    Patient ID: Stephanie Montes, female    DOB: 05/23/1976, 37 y.o.   MRN: [id number]  HPI Pt was noted recently on routine blood test to have a suppressed TSH.  She says in retrospect, she has a few years of moderate pain throughout the body, and assoc tremor. Past Medical History  Diagnosis Date  . Ovarian cyst   . Hypertension   . Anxiety     Past Surgical History  Procedure Laterality Date  . Laparoscopy    . Dental surgery  07/2012    tooth implant    History   Social History  . Marital Status: Married    Spouse Name: N/A    Number of Children: N/A  . Years of Education: N/A   Occupational History  . Not on file.   Social History Main Topics  . Smoking status: Current Every Day Smoker  . Smokeless tobacco: Never Used     Comment: 8-9 cigareetes daily  . Alcohol Use: No  . Drug Use: No  . Sexual Activity: Yes    Birth Control/ Protection: Condom   Other Topics Concern  . Not on file   Social History Narrative   Married, 10 years   Has a step son who is 59- she has raised him   Not currently working- wants to find a job   She is working on an Tourist information centre manager at [employer]. Husband is Scientist, research (medical) at [employer]   Has a sister who lives in [place]. Does not have a great relationship with her mom.   Enjoys reading/puzzles.      Current Outpatient Prescriptions on File Prior to Visit  Medication Sig Dispense Refill  . ALPRAZolam (XANAX) 0.5 MG tablet Take 1 tablet (0.5 mg total) by mouth 3 (three) times daily as needed.  90 tablet  0  . amLODipine (NORVASC) 5 MG tablet Take 1 tablet (5 mg total) by mouth daily.  30 tablet  2  . escitalopram (LEXAPRO) 10 MG tablet Take 2 tablets (20 mg total) by mouth daily.  60 tablet  2  . hydrochlorothiazide (HYDRODIURIL) 25 MG tablet Take 1 tablet (25 mg total) by mouth daily.  30 tablet  2  . cefUROXime (CEFTIN) 500 MG tablet Take 1 tablet (500 mg total) by mouth 2 (two) times daily.  14 tablet  0  . nystatin  (MYCOSTATIN) 100000 UNIT/ML suspension Take 5 mLs (500,000 Units total) by mouth 4 (four) times daily.  180 mL  0  . Vitamin D, Ergocalciferol, (DRISDOL) 50000 UNITS CAPS Take 50,000 Units by mouth.       No current facility-administered medications on file prior to visit.    No Known Allergies  Family History  Problem Relation Age of Onset  . Anesthesia problems Neg Hx   . Cancer Mother 104    breast  . Osteoporosis Mother   . Thyroid disease Mother     hypothyroidism / grave's disease  . Cancer Sister 88    breast  . Stroke Father   . Hypertension Father   . Aneurysm Father     brain  . Cancer Paternal Aunt     breast  . Cancer Maternal Grandfather     ?bladder  . Cancer Cousin 42    breast  . Cancer Maternal Aunt     ?brain tumor  . Cancer Paternal Aunt     ovarian  . Cancer Cousin     brain tumors  . Alzheimer's disease Paternal  Uncle   . Thyroid disease Maternal Grandmother     grave's disease   BP 128/70  Pulse 90  Ht 5\' 4"  (1.626 m)  Wt 141 lb (63.957 kg)  BMI 24.19 kg/m2  SpO2 98%  LMP 01/02/2013  Review of Systems She has insomnia, excessive diaphoresis, nausea, diarrhea, fatigue, acral numbness, tremor, easy bruising, palpitations, doe, urinary frequency, anxiety, rhinorrhea, and blurry vision.  She denies hoarseness.  She has lost a few lbs.  She has regular menses.      Objective:   Physical Exam VS: see vs page GEN: no distress HEAD: head: no deformity eyes: no periorbital swelling, no proptosis external nose and ears are normal mouth: no lesion seen NECK: supple, thyroid is not enlarged CHEST WALL: no deformity LUNGS:  Clear to auscultation CV: reg rate and rhythm, no murmur ABD: abdomen is soft, nontender.  no hepatosplenomegaly.  not distended.  no hernia MUSCULOSKELETAL: muscle bulk and strength are grossly normal.  no obvious joint swelling.  gait is normal and steady EXTEMITIES: no deformity.  no ulcer on the feet.  feet are of normal  color and temp.  no edema PULSES: dorsalis pedis intact bilat.  no carotid bruit NEURO:  cn 2-12 grossly intact.   readily moves all 4's.  sensation is intact to touch on the feet.   Moderate tremor of the hands.   SKIN:  Normal texture and temperature.  No rash or suspicious lesion is visible.  Not diaphoretic NODES:  None palpable at the neck PSYCH: alert, oriented x3.  Does not appear anxious nor depressed. Lab Results  Component Value Date   TSH 0.178* 01/23/2013  thyroid US: normal    Assessment & Plan:  Hyperthyroidism, new, uncertain etiology; mild Anxiety: uncertain to what extent this is thyroid-related.  However, if suppressed TSH persists, a trial of normalization with tapazole would be reasonable, to see if sxs get better.   Myalgias, unlikely thyroid-related

## 2013-02-14 NOTE — Patient Instructions (Addendum)
Let's recheck the thyroid blood tests today.  We'll contact you with results. If the results are the same, i'll prescribe for you a pill to normalize the thyroid level.   The reason for this abnormality is not yet clear.  We may have to follow this with time to see.

## 2013-02-15 LAB — TSH: TSH: 0.38 u[IU]/mL (ref 0.35–5.50)

## 2013-02-23 ENCOUNTER — Telehealth: Payer: Self-pay | Admitting: Endocrinology

## 2013-02-23 NOTE — Telephone Encounter (Signed)
Left message

## 2013-02-23 NOTE — Telephone Encounter (Signed)
Pt advised and appt made

## 2013-02-26 ENCOUNTER — Ambulatory Visit (HOSPITAL_BASED_OUTPATIENT_CLINIC_OR_DEPARTMENT_OTHER)
Admission: RE | Admit: 2013-02-26 | Discharge: 2013-02-26 | Disposition: A | Discharge status: Home / Self Care | Payer: BC Managed Care – PPO | Source: Ambulatory Visit | Attending: Family | Admitting: Family

## 2013-02-26 ENCOUNTER — Other Ambulatory Visit: Payer: Self-pay | Admitting: Family

## 2013-02-26 ENCOUNTER — Encounter: Payer: Self-pay | Admitting: Family

## 2013-02-26 ENCOUNTER — Ambulatory Visit: Payer: BC Managed Care – PPO | Admitting: Family

## 2013-02-26 ENCOUNTER — Telehealth: Payer: Self-pay | Admitting: Family

## 2013-02-26 ENCOUNTER — Ambulatory Visit (INDEPENDENT_AMBULATORY_CARE_PROVIDER_SITE_OTHER): Payer: BC Managed Care – PPO | Admitting: Family

## 2013-02-26 VITALS — BP 150/82 | HR 102 | Temp 98.2°F | Resp 16 | Wt 146.0 lb

## 2013-02-26 DIAGNOSIS — R52 Pain, unspecified: Secondary | ICD-10-CM

## 2013-02-26 DIAGNOSIS — R109 Unspecified abdominal pain: Secondary | ICD-10-CM | POA: Insufficient documentation

## 2013-02-26 DIAGNOSIS — R911 Solitary pulmonary nodule: Secondary | ICD-10-CM

## 2013-02-26 DIAGNOSIS — Z0189 Encounter for other specified special examinations: Secondary | ICD-10-CM

## 2013-02-26 DIAGNOSIS — R300 Dysuria: Secondary | ICD-10-CM

## 2013-02-26 DIAGNOSIS — R319 Hematuria, unspecified: Secondary | ICD-10-CM | POA: Insufficient documentation

## 2013-02-26 LAB — CBC WITH DIFFERENTIAL/PLATELET
Eosinophils Absolute: 0.4 10*3/uL (ref 0.0–0.7)
Hemoglobin: 11.5 g/dL — ABNORMAL LOW (ref 12.0–15.0)
Lymphs Abs: 2 10*3/uL (ref 0.7–4.0)
MCH: 29 pg (ref 26.0–34.0)
Monocytes Relative: 5 % (ref 3–12)
Neutro Abs: 9.2 10*3/uL — ABNORMAL HIGH (ref 1.7–7.7)
Neutrophils Relative %: 74 % (ref 43–77)
Platelets: 368 10*3/uL (ref 150–400)
RBC: 3.97 MIL/uL (ref 3.87–5.11)
WBC: 12.3 10*3/uL — ABNORMAL HIGH (ref 4.0–10.5)

## 2013-02-26 LAB — BASIC METABOLIC PANEL
BUN: 8 mg/dL (ref 6–23)
CO2: 23 mEq/L (ref 19–32)
Calcium: 9.3 mg/dL (ref 8.4–10.5)
Glucose, Bld: 94 mg/dL (ref 70–99)
Potassium: 4.3 mEq/L (ref 3.5–5.3)
Sodium: 135 mEq/L (ref 135–145)

## 2013-02-26 LAB — POCT URINALYSIS DIPSTICK
Bilirubin, UA: NEGATIVE
Glucose, UA: NEGATIVE
Ketones, UA: NEGATIVE
Nitrite, UA: NEGATIVE
Protein, UA: NEGATIVE
Spec Grav, UA: 1.01
pH, UA: 6

## 2013-02-26 LAB — POCT URINE HCG BY VISUAL COLOR COMPARISON TESTS: Preg Test, Ur: NEGATIVE

## 2013-02-26 MED ORDER — CIPROFLOXACIN HCL 500 MG PO TABS: 500.0000 mg | ORAL_TABLET | Freq: Two times a day (BID) | ORAL | Status: DC

## 2013-02-26 MED ORDER — HYDROCODONE-ACETAMINOPHEN 5-300 MG PO TABS: 1.0000 | ORAL_TABLET | Freq: Four times a day (QID) | ORAL | Status: DC | PRN

## 2013-02-26 NOTE — Patient Instructions (Signed)
Please complete your lab work prior to leaving. Complete your CT scan on the first floor. Start cipro for urinary tract infection. You may use hydrocodone as needed for pain. Do not drive after taking this medication as it may cause drowsiness. Follow up in 1 week.

## 2013-02-26 NOTE — Progress Notes (Signed)
Subjective:    Patient ID: Stephanie Montes, female    DOB: 02/07/1976, 37 y.o.   MRN: [id number]  HPI  Stephanie Montes is a 37 yr old female who presents today with complaint of urinary frequency.  Symptoms started Saturday AM, worsening.  Associated right low back pain.  Has ben using a heating pad, "can't get comfortable."  She denies fever.  Denies gross hematuria. Reports mild nausea.  Denies hx of kidney stones, but her sister has hx of recurrent stones.     Review of Systems See HPI Past Medical History  Diagnosis Date  . Ovarian cyst   . Hypertension   . Anxiety     History   Social History  . Marital Status: Married    Spouse Name: N/A    Number of Children: N/A  . Years of Education: N/A   Occupational History  . Not on file.   Social History Main Topics  . Smoking status: Current Every Day Smoker  . Smokeless tobacco: Never Used     Comment: 8-9 cigareetes daily  . Alcohol Use: No  . Drug Use: No  . Sexual Activity: Yes    Birth Control/ Protection: Condom   Other Topics Concern  . Not on file   Social History Narrative   Married, 10 years   Has a step son who is 87- she has raised him   Not currently working- wants to find a job   She is working on an Tourist information centre manager at [employer]. Husband is Scientist, research (medical) at [employer]   Has a sister who lives in [place]. Does not have a great relationship with her mom.   Enjoys reading/puzzles.      Past Surgical History  Procedure Laterality Date  . Laparoscopy    . Dental surgery  07/2012    tooth implant    Family History  Problem Relation Age of Onset  . Anesthesia problems Neg Hx   . Cancer Mother 12    breast  . Osteoporosis Mother   . Thyroid disease Mother     hypothyroidism / grave's disease  . Cancer Sister 41    breast  . Stroke Father   . Hypertension Father   . Aneurysm Father     brain  . Cancer Paternal Aunt     breast  . Cancer Maternal Grandfather     ?bladder  . Cancer Cousin 42   breast  . Cancer Maternal Aunt     ?brain tumor  . Cancer Paternal Aunt     ovarian  . Cancer Cousin     brain tumors  . Alzheimer's disease Paternal Uncle   . Thyroid disease Maternal Grandmother     grave's disease    No Known Allergies  Current Outpatient Prescriptions on File Prior to Visit  Medication Sig Dispense Refill  . ALPRAZolam (XANAX) 0.5 MG tablet Take 1 tablet (0.5 mg total) by mouth 3 (three) times daily as needed.  90 tablet  0  . amLODipine (NORVASC) 5 MG tablet Take 1 tablet (5 mg total) by mouth daily.  30 tablet  2  . escitalopram (LEXAPRO) 10 MG tablet Take 2 tablets (20 mg total) by mouth daily.  60 tablet  2  . hydrochlorothiazide (HYDRODIURIL) 25 MG tablet Take 1 tablet (25 mg total) by mouth daily.  30 tablet  2  . nystatin (MYCOSTATIN) 100000 UNIT/ML suspension Take 5 mLs (500,000 Units total) by mouth 4 (four) times daily.  180 mL  0  No current facility-administered medications on file prior to visit.    BP 150/82  Pulse 102  Temp(Src) 98.2 F (36.8 C) (Oral)  Resp 16  Wt 146 lb 0.6 oz (66.243 kg)  BMI 25.06 kg/m2  SpO2 99%  LMP 02/05/2013       Objective:   Physical Exam  Constitutional: She is oriented to person, place, and time. She appears well-developed and well-nourished.  Uncomfortable appearing female.  Cardiovascular:  No murmur heard. Mild tachycardia  Pulmonary/Chest: Effort normal and breath sounds normal. No respiratory distress. She has no wheezes. She has no rales. She exhibits no tenderness.  Abdominal: Soft. Bowel sounds are normal. She exhibits no distension.  Suprapubic tenderness and RLQ tenderness to palpation  Genitourinary:  R CVAT noted  Neurological: She is alert and oriented to person, place, and time.  Skin: Skin is warm and dry.  Psychiatric: She has a normal mood and affect. Her behavior is normal. Judgment and thought content normal.          Assessment & Plan:

## 2013-02-26 NOTE — Assessment & Plan Note (Addendum)
Suspect pyelo versus nephrolithiasis.  Ovarian cyst is in differential. Will obtain CBC, BMET to assess renal function.  CT abd/pelvis with kidney stone protocol to further evaluate.  UA notable for large blood.  Otherwise negative. Culture urine.  Will plan empiric rx with cipro.

## 2013-02-26 NOTE — Telephone Encounter (Signed)
Reviewed CT abdomen results.  Phoned pt.  Reviewed negative abdominal results. Advised her to start abx empirically or UTI.  Can try anti-inflammatory first as needed for possible musculoskeletal component. Only use vicodin as needed for severe pain.  Note is made of small lung nodule. As pt is a smoker, will plan to repeat CT in 1 year- pt is aware.

## 2013-02-27 LAB — URINE CULTURE

## 2013-02-28 ENCOUNTER — Other Ambulatory Visit: Payer: Self-pay | Admitting: Family

## 2013-02-28 DIAGNOSIS — D72829 Elevated white blood cell count, unspecified: Secondary | ICD-10-CM

## 2013-02-28 NOTE — Telephone Encounter (Signed)
Urine shows a small amount of mixed bacteria. She should complete cipro.  Blood count still showing mild elevation of white blood cells. I see looking back that her wbc is generally mildly elevated.  I would recommend that she repeat CBC in 2 weeks.  If wbc is not improved after abx for urine, then I will plan to set her up to see hematology. Also mildly anemic- pls ask lab to add on iron, b12, folate.

## 2013-03-01 ENCOUNTER — Telehealth: Payer: Self-pay | Admitting: Family

## 2013-03-01 MED ORDER — VARENICLINE TARTRATE 0.5 MG X 11 & 1 MG X 42 PO MISC: ORAL | Status: DC

## 2013-03-01 NOTE — Telephone Encounter (Signed)
Rx sent.  Please advise pt re: rare risk of suicidal thoughts while taking this medication.  Go directly to ED if this occurs. May smoke for 1st week, then should stop smoking. Call before pack runs out to let us know how she is doing and so we can call in the continuation packs- she should plan to continue x 12 weeks total.

## 2013-03-01 NOTE — Telephone Encounter (Signed)
Patient states that she would like to try chantix to help her quit smoking. [facility] on [address]

## 2013-03-01 NOTE — Telephone Encounter (Signed)
Also, please advise re: alprazolam request. Last filled 02/01/13, #90.

## 2013-03-02 ENCOUNTER — Telehealth: Payer: Self-pay | Admitting: *Deleted

## 2013-03-02 DIAGNOSIS — R911 Solitary pulmonary nodule: Secondary | ICD-10-CM

## 2013-03-02 LAB — IRON: Iron: 39 ug/dL — ABNORMAL LOW (ref 42–145)

## 2013-03-02 LAB — FOLATE: Folate: 15.2 ng/mL

## 2013-03-02 LAB — VITAMIN B12: Vitamin B-12: 1024 pg/mL — ABNORMAL HIGH (ref 211–911)

## 2013-03-02 NOTE — Telephone Encounter (Signed)
Rx called to pharmacy voicemail. Pt notified of results below. Lab order entered. Additional labs below added per Selena Batten at [facility]. Awaiting result.

## 2013-03-02 NOTE — Telephone Encounter (Signed)
Notified pt and she voices understanding. 

## 2013-03-02 NOTE — Telephone Encounter (Signed)
Spoke with pt. She remains concerned about recent lung finding on CT and does not want to wait until 1 year to repeat CT. Pt reports "strong family history of cancer". Pt requests second opinion of radiologic reading. Please advise.

## 2013-03-02 NOTE — Telephone Encounter (Signed)
OK to send alprazolam #90 please.

## 2013-03-02 NOTE — Telephone Encounter (Signed)
I will send to pulmonary for consultation.

## 2013-03-04 ENCOUNTER — Telehealth: Payer: Self-pay | Admitting: Family

## 2013-03-04 DIAGNOSIS — D509 Iron deficiency anemia, unspecified: Secondary | ICD-10-CM

## 2013-03-04 NOTE — Telephone Encounter (Signed)
Iron level a little low. Start otc iron 325 mg one daily. Repeat iron and cbc in 3 months.wbc continues to be elevated.I would like her to be evaluated by hematology.

## 2013-03-05 NOTE — Telephone Encounter (Signed)
Per verbal from Provider, previous refill note of 02/28/13 addresses hematology finding as pt will return around 03/16/13 for repeat CBC after completing abx for possible UTI and will place referral for hematology at that time if WBC still elevated. Patient will need to proceed with repeat iron in 3 months.

## 2013-03-06 NOTE — Telephone Encounter (Signed)
Left message for pt to return my call.

## 2013-03-06 NOTE — Telephone Encounter (Signed)
Notified pt of below results and she voices understanding. Lab order entered for iron in 3 months.

## 2013-03-06 NOTE — Telephone Encounter (Signed)
Patient returned phone call. Best #s [phone] or [phone]

## 2013-03-07 ENCOUNTER — Ambulatory Visit (INDEPENDENT_AMBULATORY_CARE_PROVIDER_SITE_OTHER)
Admission: RE | Admit: 2013-03-07 | Discharge: 2013-03-07 | Disposition: A | Discharge status: Home / Self Care | Payer: BC Managed Care – PPO | Source: Ambulatory Visit | Attending: Internal Medicine | Admitting: Internal Medicine

## 2013-03-07 ENCOUNTER — Telehealth: Payer: Self-pay | Admitting: *Deleted

## 2013-03-07 ENCOUNTER — Ambulatory Visit (INDEPENDENT_AMBULATORY_CARE_PROVIDER_SITE_OTHER): Payer: BC Managed Care – PPO | Admitting: Internal Medicine

## 2013-03-07 ENCOUNTER — Encounter: Payer: Self-pay | Admitting: Internal Medicine

## 2013-03-07 VITALS — BP 140/82 | HR 92 | Ht 64.0 in | Wt 148.0 lb

## 2013-03-07 DIAGNOSIS — R911 Solitary pulmonary nodule: Secondary | ICD-10-CM

## 2013-03-07 DIAGNOSIS — M25519 Pain in unspecified shoulder: Secondary | ICD-10-CM

## 2013-03-07 DIAGNOSIS — M25511 Pain in right shoulder: Secondary | ICD-10-CM

## 2013-03-07 NOTE — Telephone Encounter (Signed)
Received message from pt requesting return call. States she has questions re: her visit with pulmonology today and request for another referral?

## 2013-03-07 NOTE — Patient Instructions (Signed)
The key is to stop smoking completely before smoking completely stops you - it's the most important aspect of your care.   Please remember to go to the  x-ray department downstairs for your tests - we will call you with the results when they are available.  I recommend a whole lung ct needs to be done around 02/28/14  You may need orthopedic evaluation for your shoulder

## 2013-03-07 NOTE — Progress Notes (Signed)
  Subjective:    Patient ID: Stephanie Montes, female    DOB: 1975-11-05  MRN: [id number]  HPI  60 yowf smoker with new cough/chest tightness x around [id number] better s need to stay on inhaler as maint rx referred to pulmonary clinic 03/07/13  by Dr Peggyann Juba for second opinion re R cp.   03/07/2013 1st [facility] office visit/ Stephanie Montes cc aches and pains x sev years but R shoulder and chest pain x 6 month worse when lie down purely positional, not pleuritic, with some am cough with min white mucus and sob with exertion indolent onset and progressive over 6 m also. Pain better only with narcotics, not nsaids   No obvious patterns in day to day or daytime variabilty or assoc   chest tightness, subjective wheeze overt sinus or hb symptoms. No unusual exp hx or h/o childhood pna/ asthma or knowledge of premature birth.  Sleeping ok without nocturnal  or early am exacerbation  of respiratory  c/o's or need for noct saba. Also denies any obvious fluctuation of symptoms with weather or environmental changes or other aggravating or alleviating factors except as outlined above   Current Medications, Allergies, Complete Past Medical History, Past Surgical History, Family History, and Social History were reviewed in [employer] record.          Review of Systems  Constitutional: Negative for fever and unexpected weight change.  HENT: Negative for congestion, dental problem, ear pain, nosebleeds, postnasal drip, rhinorrhea, sinus pressure, sneezing, sore throat and trouble swallowing.   Eyes: Negative for redness and itching.  Respiratory: Positive for cough, chest tightness and shortness of breath. Negative for wheezing.   Cardiovascular: Negative for palpitations and leg swelling.  Gastrointestinal: Negative for nausea and vomiting.  Genitourinary: Negative for dysuria.  Musculoskeletal: Negative for joint swelling.  Skin: Negative for rash.  Neurological: Negative for  headaches.  Hematological: Does not bruise/bleed easily.  Psychiatric/Behavioral: Negative for dysphoric mood. The patient is not nervous/anxious.        Objective:   Physical Exam  amb somber wf nad Wt Readings from Last 3 Encounters:  03/07/13 148 lb (67.132 kg)  02/26/13 146 lb 0.6 oz (66.243 kg)  02/14/13 141 lb (63.957 kg)      HEENT: nl dentition, turbinates, and orophanx. Nl external ear canals without cough reflex   NECK :  without JVD/Nodes/TM/ nl carotid upstrokes bilaterally   LUNGS: no acc muscle use, clear to A and P bilaterally without cough on insp or exp maneuvers   CV:  RRR  no s3 or murmur or increase in P2, no edema   ABD:  soft and nontender with nl excursion in the supine position. No bruits or organomegaly, bowel sounds nl  MS:  warm without deformities, calf tenderness, cyanosis or clubbing Pain worse with abd and ext rotation R shoulder such as reaching behind head to comb hair  SKIN: warm and dry without lesions    NEURO:  alert, approp, no deficits    CXR  03/07/2013 :  No active cardiopulmonary disease. My review  R apex looks fine         Assessment & Plan:

## 2013-03-08 ENCOUNTER — Telehealth: Payer: Self-pay | Admitting: Internal Medicine

## 2013-03-08 NOTE — Telephone Encounter (Signed)
Pt informed of cxr results per Dr Wert 

## 2013-03-09 ENCOUNTER — Ambulatory Visit: Payer: BC Managed Care – PPO | Admitting: Family

## 2013-03-09 DIAGNOSIS — Z0289 Encounter for other administrative examinations: Secondary | ICD-10-CM

## 2013-03-10 DIAGNOSIS — M25519 Pain in unspecified shoulder: Secondary | ICD-10-CM | POA: Insufficient documentation

## 2013-03-10 NOTE — Assessment & Plan Note (Signed)
CT abd 02/26/13  - 3 mm pleural-based nodule at the left lung base on image  12. Otherwise, the lung bases are clear  - eval in pulm clinic 03/07/13 rec repeat whole lung ct 01/2014   Discussed in detail all the  indications, usual  risks and alternatives  relative to the benefits with patient who agrees to proceed with repeat ct of whole lung in one year and in meantime make every effort to stop smoking.

## 2013-03-10 NOTE — Assessment & Plan Note (Signed)
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

## 2013-03-13 NOTE — Telephone Encounter (Signed)
Spoke with pt and apologized for the delay in returning her call. I see that pt has appt tomorrow for possible broken toe and she states she will discuss additional concerns with Provider at that time.

## 2013-03-14 ENCOUNTER — Ambulatory Visit (HOSPITAL_BASED_OUTPATIENT_CLINIC_OR_DEPARTMENT_OTHER)
Admission: RE | Admit: 2013-03-14 | Discharge: 2013-03-14 | Disposition: A | Discharge status: Home / Self Care | Payer: BC Managed Care – PPO | Source: Ambulatory Visit | Attending: Family | Admitting: Family

## 2013-03-14 ENCOUNTER — Ambulatory Visit (INDEPENDENT_AMBULATORY_CARE_PROVIDER_SITE_OTHER): Payer: BC Managed Care – PPO | Admitting: Family

## 2013-03-14 ENCOUNTER — Encounter: Payer: Self-pay | Admitting: Family

## 2013-03-14 VITALS — BP 148/90 | HR 95 | Temp 98.0°F | Resp 16 | Ht 65.0 in | Wt 148.0 lb

## 2013-03-14 DIAGNOSIS — M79675 Pain in left toe(s): Secondary | ICD-10-CM

## 2013-03-14 DIAGNOSIS — M79609 Pain in unspecified limb: Secondary | ICD-10-CM

## 2013-03-14 DIAGNOSIS — X58XXXA Exposure to other specified factors, initial encounter: Secondary | ICD-10-CM | POA: Insufficient documentation

## 2013-03-14 DIAGNOSIS — S8990XA Unspecified injury of unspecified lower leg, initial encounter: Secondary | ICD-10-CM | POA: Insufficient documentation

## 2013-03-14 DIAGNOSIS — S99922A Unspecified injury of left foot, initial encounter: Secondary | ICD-10-CM

## 2013-03-14 NOTE — Patient Instructions (Signed)
Please complete x-ray on the first floor.  

## 2013-03-14 NOTE — Progress Notes (Signed)
Subjective:    Patient ID: Stephanie Montes, female    DOB: 29-Sep-1975, 37 y.o.   MRN: [id number]  HPI  Stephanie Montes is a 37 yr old female who presents today following injury yesterday to the let 4th toe.   She has been using tylenol and aleve without improvement.  She reports that yesterday the bottom of the door swung and jammed her left great toe. Since that time she has had throbbing pain in the toe.    Review of Systems See HPI  Past Medical History  Diagnosis Date  . Ovarian cyst   . Hypertension   . Anxiety     History   Social History  . Marital Status: Married    Spouse Name: N/A    Number of Children: N/A  . Years of Education: N/A   Occupational History  . Not on file.   Social History Main Topics  . Smoking status: Former Smoker    Quit date: 03/10/2013  . Smokeless tobacco: Never Used     Comment: 8-9 cigareetes daily  . Alcohol Use: No  . Drug Use: No  . Sexual Activity: Yes    Birth Control/ Protection: Condom   Other Topics Concern  . Not on file   Social History Narrative   Married, 10 years   Has a step son who is 37- she has raised him   Not currently working- wants to find a job   She is working on an Tourist information centre manager at [employer]. Husband is Scientist, research (medical) at [employer]   Has a sister who lives in [place]. Does not have a great relationship with her mom.   Enjoys reading/puzzles.      Past Surgical History  Procedure Laterality Date  . Laparoscopy    . Dental surgery  07/2012    tooth implant    Family History  Problem Relation Age of Onset  . Anesthesia problems Neg Hx   . Cancer Mother 38    breast  . Osteoporosis Mother   . Thyroid disease Mother     hypothyroidism / grave's disease  . Cancer Sister 81    breast  . Stroke Father   . Hypertension Father   . Aneurysm Father     brain  . Cancer Paternal Aunt     breast  . Cancer Maternal Grandfather     ?bladder  . Cancer Cousin 42    breast  . Cancer Maternal Aunt     ?brain  tumor  . Cancer Paternal Aunt     ovarian  . Cancer Cousin     brain tumors  . Alzheimer's disease Paternal Uncle   . Thyroid disease Maternal Grandmother     grave's disease    No Known Allergies  Current Outpatient Prescriptions on File Prior to Visit  Medication Sig Dispense Refill  . ALPRAZolam (XANAX) 0.5 MG tablet TAKE 1 TABLET 3 TIMES A DAY AS NEEDED  90 tablet  0  . amLODipine (NORVASC) 5 MG tablet Take 1 tablet (5 mg total) by mouth daily.  30 tablet  2  . escitalopram (LEXAPRO) 10 MG tablet Take 2 tablets (20 mg total) by mouth daily.  60 tablet  2  . ferrous sulfate 325 (65 FE) MG tablet Take 325 mg by mouth daily with breakfast.      . hydrochlorothiazide (HYDRODIURIL) 25 MG tablet Take 1 tablet (25 mg total) by mouth daily.  30 tablet  2  . nystatin (MYCOSTATIN) 100000 UNIT/ML suspension  Take 5 mLs (500,000 Units total) by mouth 4 (four) times daily.  180 mL  0  . varenicline (CHANTIX STARTING MONTH PAK) 0.5 MG X 11 & 1 MG X 42 tablet Take one 0.5 mg tablet by mouth once daily for 3 days, then increase to one 0.5 mg tablet twice daily for 4 days, then increase to one 1 mg tablet twice daily.  53 tablet  0   No current facility-administered medications on file prior to visit.    BP 148/90  Pulse 95  Temp(Src) 98 F (36.7 C) (Oral)  Resp 16  Ht 5\' 5"  (1.651 m)  Wt 148 lb (67.132 kg)  BMI 24.63 kg/m2  SpO2 99%  LMP 03/09/2013       Objective:   Physical Exam  Constitutional: She appears well-developed and well-nourished. No distress.  Musculoskeletal:  Left 4th toe is without swelling, or ecchymosis.   Psychiatric: She has a normal mood and affect. Her behavior is normal. Judgment and thought content normal.          Assessment & Plan:

## 2013-03-14 NOTE — Assessment & Plan Note (Signed)
Will obtain x ray to exclude fracture. Continue aleve for pain.

## 2013-03-19 ENCOUNTER — Ambulatory Visit: Payer: BC Managed Care – PPO | Admitting: Family

## 2013-03-19 DIAGNOSIS — Z0289 Encounter for other administrative examinations: Secondary | ICD-10-CM

## 2013-03-21 ENCOUNTER — Telehealth: Payer: Self-pay | Admitting: *Deleted

## 2013-03-21 MED ORDER — VARENICLINE TARTRATE 1 MG PO TABS: 1.0000 mg | ORAL_TABLET | Freq: Two times a day (BID) | ORAL | Status: DC

## 2013-03-21 NOTE — Telephone Encounter (Signed)
Left message that request has been completed and to call if any questions.

## 2013-03-21 NOTE — Telephone Encounter (Signed)
Sent!

## 2013-03-21 NOTE — Telephone Encounter (Signed)
Received message from pt that she is almost done with the chantix starter pack. Pt states she feels she will need the continuation pack and is requesting Rx.  Please advise.

## 2013-03-22 ENCOUNTER — Ambulatory Visit (INDEPENDENT_AMBULATORY_CARE_PROVIDER_SITE_OTHER): Payer: BC Managed Care – PPO | Admitting: Physician Assistant

## 2013-03-22 ENCOUNTER — Encounter: Payer: Self-pay | Admitting: Physician Assistant

## 2013-03-22 VITALS — BP 142/82 | HR 96 | Temp 97.8°F | Resp 16 | Ht 65.0 in | Wt 149.8 lb

## 2013-03-22 DIAGNOSIS — M545 Low back pain: Secondary | ICD-10-CM

## 2013-03-22 MED ORDER — METHOCARBAMOL 500 MG PO TABS: 500.0000 mg | ORAL_TABLET | Freq: Three times a day (TID) | ORAL | Status: DC

## 2013-03-22 MED ORDER — HYDROCODONE-ACETAMINOPHEN 10-325 MG PO TABS: 1.0000 | ORAL_TABLET | Freq: Three times a day (TID) | ORAL | Status: DC | PRN

## 2013-03-22 NOTE — Patient Instructions (Signed)
Please take medications as prescribed.  Alternate moist heat and cold compresses.  Topical Aspercreme or Salon Pas.  Rest.  Please return to clinic if symptoms do not improve as you will need further evaluation and imaging.

## 2013-03-22 NOTE — Progress Notes (Signed)
Patient ID: Stephanie Montes, female   DOB: August 08, 1975, 37 y.o.   MRN: [id number]  Patient presents to clinic today c/o right-sided low back pain x 3 days.  Patient endorses that she was woken up from sleep by pain that she described as throbbing in nature.  Pain is located mostly in the right lumbar region, stretching around the right side of her body, and occasional there is "tenderness" in her right thigh.  Pain is worse with movement.  She does endorse moving heavy furniture for the past 2 days.  Also endorses falling on her "butt bone" while walking down the stairs 1 day ago.  Denies numbness or tingling of low back, groin or RLE.  Has taken tylenol and Advil with minimal relief of her pain.  Denies fever, chills, sweats.  Denies urinary symptoms, flank pain or history of kidney stone.   Past Medical History  Diagnosis Date  . Ovarian cyst   . Hypertension   . Anxiety     Current Outpatient Prescriptions on File Prior to Visit  Medication Sig Dispense Refill  . ALPRAZolam (XANAX) 0.5 MG tablet TAKE 1 TABLET 3 TIMES A DAY AS NEEDED  90 tablet  0  . amLODipine (NORVASC) 5 MG tablet Take 1 tablet (5 mg total) by mouth daily.  30 tablet  2  . escitalopram (LEXAPRO) 10 MG tablet Take 2 tablets (20 mg total) by mouth daily.  60 tablet  2  . hydrochlorothiazide (HYDRODIURIL) 25 MG tablet Take 1 tablet (25 mg total) by mouth daily.  30 tablet  2  . varenicline (CHANTIX CONTINUING MONTH PAK) 1 MG tablet Take 1 tablet (1 mg total) by mouth 2 (two) times daily.  60 tablet  1   No current facility-administered medications on file prior to visit.    No Known Allergies  Family History  Problem Relation Age of Onset  . Anesthesia problems Neg Hx   . Cancer Mother 71    breast  . Osteoporosis Mother   . Thyroid disease Mother     hypothyroidism / grave's disease  . Cancer Sister 45    breast  . Stroke Father   . Hypertension Father   . Aneurysm Father     brain  . Cancer Paternal Aunt      breast  . Cancer Maternal Grandfather     ?bladder  . Cancer Cousin 42    breast  . Cancer Maternal Aunt     ?brain tumor  . Cancer Paternal Aunt     ovarian  . Cancer Cousin     brain tumors  . Alzheimer's disease Paternal Uncle   . Thyroid disease Maternal Grandmother     grave's disease    History   Social History  . Marital Status: Married    Spouse Name: N/A    Number of Children: N/A  . Years of Education: N/A   Social History Main Topics  . Smoking status: Former Smoker    Quit date: 03/10/2013  . Smokeless tobacco: Never Used     Comment: 8-9 cigareetes daily  . Alcohol Use: No  . Drug Use: No  . Sexual Activity: Yes    Birth Control/ Protection: Condom   Other Topics Concern  . 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 Husband is Scientist, research (medical) at [employer]   Has a sister who lives in [place]. Does not have a great relationship with her mom.   Enjoys reading/puzzles.     ROS See HPI.  All other ROS are negative.  Filed Vitals:   03/22/13 1502  BP: 142/82  Pulse: 96  Temp: 97.8 F (36.6 C)  Resp: 16    Physical Exam  Vitals reviewed. Constitutional: She is oriented to person, place, and time and well-developed, well-nourished, and in no distress.  HENT:  Head: Normocephalic and atraumatic.  Right Ear: External ear normal.  Left Ear: External ear normal.  Nose: Nose normal.  Mouth/Throat: Oropharynx is clear and moist. No oropharyngeal exudate.  Eyes: Conjunctivae are normal.  Neck: Normal range of motion. Neck supple.  Cardiovascular: Normal rate, regular rhythm and normal heart sounds.   Pulmonary/Chest: Effort normal and breath sounds normal. No respiratory distress. She has no wheezes. She has no rales. She exhibits no tenderness.  Musculoskeletal:       Right knee: Normal.       Cervical  back: Normal.       Thoracic back: Normal.       Lumbar back: She exhibits tenderness, pain and spasm. She exhibits normal range of motion, no bony tenderness, no swelling, no edema, no deformity, no laceration and normal pulse.       Right upper leg: She exhibits tenderness. She exhibits no bony tenderness, no swelling, no edema, no deformity and no laceration.       Left upper leg: Normal.       Right lower leg: Normal.       Left lower leg: Normal.  Neurological: She is alert and oriented to person, place, and time.  Skin: Skin is warm and dry.  Psychiatric: Affect normal.     Recent Results (from the past 2160 hour(s))  BASIC METABOLIC PANEL WITH GFR     Status: Abnormal   Collection Time    01/23/13 12:13 PM      Result Value Range   Sodium 138  135 - 145 mEq/L   Potassium 3.9  3.5 - 5.3 mEq/L   Chloride 101  96 - 112 mEq/L   CO2 26  19 - 32 mEq/L   Glucose, Bld 95  70 - 99 mg/dL   BUN 10  6 - 23 mg/dL   Creat 1.61 (*) 0.96 - 1.10 mg/dL   Calcium 9.6  8.4 - 04.5 mg/dL   GFR, Est African American >89     GFR, Est Non African American >89     Comment:       The estimated GFR is a calculation valid for adults (>=33 years old)     that uses the CKD-EPI algorithm to adjust for age and sex. It is       not to be used for children, pregnant women, hospitalized patients,        patients on dialysis, or with rapidly changing kidney function.     According to the NKDEP, eGFR >89 is normal, 60-89 shows mild     impairment, 30-59 shows moderate impairment, 15-29 shows severe     impairment and <15 is ESRD.        CBC WITH DIFFERENTIAL     Status: Abnormal   Collection Time    01/23/13 12:13 PM      Result Value Range   WBC 12.6 (*) 4.0 - 10.5 K/uL   RBC 4.34  3.87 - 5.11 MIL/uL   Hemoglobin 12.5  12.0 - 15.0 g/dL   HCT 40.9  81.1 - 91.4 %   MCV 86.6  78.0 - 100.0 fL   MCH 28.8  26.0 - 34.0 pg   MCHC 33.2  30.0 - 36.0 g/dL   RDW 78.2 (*) 95.6 - 21.3 %   Platelets 435 (*) 150  - 400 K/uL   Neutrophils Relative % 71  43 - 77 %   Neutro Abs 9.0 (*) 1.7 - 7.7 K/uL   Lymphocytes Relative 19  12 - 46 %   Lymphs Abs 2.4  0.7 - 4.0 K/uL   Monocytes Relative 6  3 - 12 %   Monocytes Absolute 0.7  0.1 - 1.0 K/uL   Eosinophils Relative 4  0 - 5 %   Eosinophils Absolute 0.4  0.0 - 0.7 K/uL   Basophils Relative 0  0 - 1 %   Basophils Absolute 0.0  0.0 - 0.1 K/uL   Smear Review Criteria for review not met    HEMOGLOBIN A1C     Status: Abnormal   Collection Time    01/23/13 12:13 PM      Result Value Range   Hemoglobin A1C 6.1 (*) <5.7 %   Comment:                                                                            According to the ADA Clinical Practice Recommendations for 2011, when     HbA1c is used as a screening test:             >=6.5%   Diagnostic of Diabetes Mellitus                (if abnormal result is confirmed)           5.7-6.4%   Increased risk of developing Diabetes Mellitus           References:Diagnosis and Classification of Diabetes Mellitus,Diabetes     Care,2011,34(Suppl 1):S62-S69 and Standards of Medical Care in             Diabetes - 2011,Diabetes Care,2011,34 (Suppl 1):S11-S61.         Mean Plasma Glucose 128 (*) <117 mg/dL  LIPID PANEL     Status: Abnormal   Collection Time    01/23/13 12:13 PM      Result Value Range   Cholesterol 199  0 - 200 mg/dL   Comment: ATP III Classification:           < 200        mg/dL        Desirable          200 - 239     mg/dL        Borderline High          >= 240        mg/dL        High         Triglycerides 152 (*) <150 mg/dL   HDL 39 (*) >08 mg/dL   Total CHOL/HDL Ratio 5.1     VLDL 30  0 - 40 mg/dL   LDL Cholesterol 657 (*) 0 -  99 mg/dL   Comment:       Total Cholesterol/HDL Ratio:CHD Risk                            Coronary Heart Disease Risk Table                                            Men       Women              1/2 Average Risk              3.4        3.3                   Average Risk              5.0        4.4               2X Average Risk              9.6        7.1               3X Average Risk             23.4       11.0     Use the calculated Patient Ratio above and the CHD Risk table      to determine the patient's CHD Risk.     ATP III Classification (LDL):           < 100        mg/dL         Optimal          100 - 129     mg/dL         Near or Above Optimal          130 - 159     mg/dL         Borderline High          160 - 189     mg/dL         High           > 190        mg/dL         Very High        TSH     Status: Abnormal   Collection Time    01/23/13 12:13 PM      Result Value Range   TSH 0.178 (*) 0.350 - 4.500 uIU/mL  URINALYSIS, ROUTINE W REFLEX MICROSCOPIC     Status: None   Collection Time    01/23/13 12:13 PM      Result Value Range   Color, Urine YELLOW  YELLOW   APPearance CLEAR  CLEAR   Specific Gravity, Urine 1.011  1.005 - 1.030   pH 6.5  5.0 - 8.0   Glucose, UA NEG  NEG mg/dL   Bilirubin Urine NEG  NEG   Ketones, ur NEG  NEG mg/dL   Hgb urine dipstick NEG  NEG   Protein, ur NEG  NEG mg/dL   Urobilinogen, UA 0.2  0.0 - 1.0 mg/dL   Nitrite NEG  NEG   Leukocytes, UA NEG  NEG  T4, FREE  Status: None   Collection Time    01/23/13 12:13 PM      Result Value Range   Free T4 1.13  0.80 - 1.80 ng/dL  T3, FREE     Status: None   Collection Time    01/23/13 12:13 PM      Result Value Range   T3, Free 3.1  2.3 - 4.2 pg/mL  T4, FREE     Status: None   Collection Time    02/14/13  3:07 PM      Result Value Range   Free T4 0.71  0.60 - 1.60 ng/dL  TSH     Status: None   Collection Time    02/14/13  3:07 PM      Result Value Range   TSH 0.38  0.35 - 5.50 uIU/mL  URINE CULTURE     Status: None   Collection Time    02/26/13  1:47 PM      Result Value Range   Colony Count 20,OOO COLONIES/ML     Organism ID, Bacteria Multiple bacterial morphotypes present, none     Organism ID, Bacteria predominant. Suggest  appropriate recollection if      Organism ID, Bacteria clinically indicated.    POCT URINALYSIS DIPSTICK     Status: None   Collection Time    02/26/13  1:48 PM      Result Value Range   Color, UA straw     Clarity, UA cloudy     Glucose, UA negative     Bilirubin, UA negative     Ketones, UA negative     Spec Grav, UA 1.010     Blood, UA large     pH, UA 6.0     Protein, UA negative     Urobilinogen, UA 0.2     Nitrite, UA negative     Leukocytes, UA Negative    CBC WITH DIFFERENTIAL     Status: Abnormal   Collection Time    02/26/13  2:27 PM      Result Value Range   WBC 12.3 (*) 4.0 - 10.5 K/uL   RBC 3.97  3.87 - 5.11 MIL/uL   Hemoglobin 11.5 (*) 12.0 - 15.0 g/dL   HCT 82.9 (*) 56.2 - 13.0 %   MCV 86.4  78.0 - 100.0 fL   MCH 29.0  26.0 - 34.0 pg   MCHC 33.5  30.0 - 36.0 g/dL   RDW 86.5 (*) 78.4 - 69.6 %   Platelets 368  150 - 400 K/uL   Neutrophils Relative % 74  43 - 77 %   Neutro Abs 9.2 (*) 1.7 - 7.7 K/uL   Lymphocytes Relative 17  12 - 46 %   Lymphs Abs 2.0  0.7 - 4.0 K/uL   Monocytes Relative 5  3 - 12 %   Monocytes Absolute 0.6  0.1 - 1.0 K/uL   Eosinophils Relative 4  0 - 5 %   Eosinophils Absolute 0.4  0.0 - 0.7 K/uL   Basophils Relative 0  0 - 1 %   Basophils Absolute 0.1  0.0 - 0.1 K/uL   Smear Review Criteria for review not met    BASIC METABOLIC PANEL     Status: Abnormal   Collection Time    02/26/13  2:27 PM      Result Value Range   Sodium 135  135 - 145 mEq/L   Potassium 4.3  3.5 - 5.3 mEq/L   Chloride 107  96 - 112 mEq/L   CO2 23  19 - 32 mEq/L   Glucose, Bld 94  70 - 99 mg/dL   BUN 8  6 - 23 mg/dL   Creat 1.91 (*) 4.78 - 1.10 mg/dL   Calcium 9.3  8.4 - 29.5 mg/dL  POCT URINE HCG BY VISUAL COLOR COMPARISON TESTS     Status: None   Collection Time    02/26/13  2:27 PM      Result Value Range   Preg Test, Ur Negative    IRON     Status: Abnormal   Collection Time    02/26/13  2:27 PM      Result Value Range   Iron 39 (*) 42 - 145 ug/dL   VITAMIN A21     Status: Abnormal   Collection Time    02/26/13  2:27 PM      Result Value Range   Vitamin B-12 1024 (*) 211 - 911 pg/mL  FOLATE     Status: None   Collection Time    02/26/13  2:27 PM      Result Value Range   Folate 15.2     Comment:       Reference Ranges             Deficient:       0.4 - 3.3 ng/mL             Indeterminate:   3.4 - 5.4 ng/mL             Normal:              > 5.4 ng/mL         Assessment/Plan: Acute low back pain W/ muscle spasm.  Rest. Ice. Rx norco with no refills, Rx Robaxin.  Topical Aspercreme.  Topical moist heat.  Given history of fall, XRAY was discussed but patient declines.  Return if symptoms not improving.

## 2013-03-23 DIAGNOSIS — M545 Low back pain: Secondary | ICD-10-CM | POA: Insufficient documentation

## 2013-03-23 NOTE — Assessment & Plan Note (Addendum)
W/ muscle spasm.  Rest. Ice. Rx norco with no refills, Rx Robaxin.  Topical Aspercreme.  Topical moist heat.  Given history of fall, XRAY was discussed but patient declines.  Return if symptoms not improving.

## 2013-03-30 ENCOUNTER — Other Ambulatory Visit: Payer: Self-pay | Admitting: Family

## 2013-03-30 NOTE — Telephone Encounter (Signed)
eScribe request for refill on Alprazolam Last filled - 10.01.14, #[id number] Last AEX - 10.15.14 Please Advise/SLS

## 2013-04-02 ENCOUNTER — Ambulatory Visit: Payer: BC Managed Care – PPO | Admitting: Endocrinology

## 2013-04-02 DIAGNOSIS — Z0289 Encounter for other administrative examinations: Secondary | ICD-10-CM

## 2013-04-02 NOTE — Telephone Encounter (Signed)
Rx request phoned to pharmacy/SLS  

## 2013-04-02 NOTE — Telephone Encounter (Signed)
OK to send #[phone] with zero refills.  

## 2013-04-09 ENCOUNTER — Ambulatory Visit (INDEPENDENT_AMBULATORY_CARE_PROVIDER_SITE_OTHER): Payer: BC Managed Care – PPO | Admitting: Family

## 2013-04-09 ENCOUNTER — Encounter: Payer: Self-pay | Admitting: Family

## 2013-04-09 VITALS — BP 160/78 | HR 117 | Temp 98.4°F | Resp 16 | Ht 65.0 in | Wt 155.0 lb

## 2013-04-09 DIAGNOSIS — M25511 Pain in right shoulder: Secondary | ICD-10-CM

## 2013-04-09 DIAGNOSIS — M25519 Pain in unspecified shoulder: Secondary | ICD-10-CM

## 2013-04-09 DIAGNOSIS — IMO0002 Reserved for concepts with insufficient information to code with codable children: Secondary | ICD-10-CM

## 2013-04-09 DIAGNOSIS — F411 Generalized anxiety disorder: Secondary | ICD-10-CM

## 2013-04-09 DIAGNOSIS — IMO0001 Reserved for inherently not codable concepts without codable children: Secondary | ICD-10-CM

## 2013-04-09 DIAGNOSIS — M545 Low back pain: Secondary | ICD-10-CM

## 2013-04-09 MED ORDER — METHYLPREDNISOLONE 4 MG PO KIT: PACK | ORAL | Status: DC

## 2013-04-09 MED ORDER — VENLAFAXINE HCL 37.5 MG PO TABS: ORAL_TABLET | ORAL | Status: DC

## 2013-04-09 NOTE — Patient Instructions (Signed)
Start medrol dose pak. You will be contacted about your referral to Dr. Pearletha Forge (sports medicine) and your MRI. Start effexor, call if worsening anxiety/depression occurs. Go to ER if you develop thoughts of hurting yourself or others.   Please follow up in 1 month.

## 2013-04-09 NOTE — Progress Notes (Signed)
Pre visit review using our clinic review tool, if applicable. No additional management support is needed unless otherwise documented below in the visit note. 

## 2013-04-09 NOTE — Progress Notes (Signed)
Subjective:    Patient ID: Stephanie Montes, female    DOB: 1975/12/01, 37 y.o.   MRN: [id number]  HPI  Stephanie Montes is a 37 yr old female who presents with the following concerns.  1) R low back pain- has been going on for about 1 week.  Pain is located in the right low back.  Pain radiates down into the right leg.  She reports that she has been using tylenol, advil, excedrin- no improvement so she stopped taking.    2) R shoulder pain- reports difficulty raising the right shoulder. She reports + pain in the hands for years.  Has shooting pain into the right pain- worse with writing/typing.   3) Depression- reports that her anxiety is "through the roof."  She reports that she is using the xanax 2-3 times a day.  Reports on occasion she needs to take 2 tabs at a time of the xanax.  Reports that she is not sleeping well.  Mind racing.   Review of Systems See HPI    Past Medical History  Diagnosis Date  . Ovarian cyst   . Hypertension   . Anxiety     History   Social History  . Marital Status: Married    Spouse Name: Stephanie Montes    Number of Children: Stephanie Montes  . Years of Education: Stephanie Montes   Occupational History  . Not on file.   Social History Main Topics  . Smoking status: Former Smoker    Quit date: 03/10/2013  . Smokeless tobacco: Never Used     Comment: 8-9 cigareetes daily  . Alcohol Use: No  . Drug Use: No  . Sexual Activity: Yes    Birth Control/ Protection: Condom   Other Topics Concern  . Not on file   Social History Narrative   Married, 10 years   Has a step son who is 15- she has raised him   Not currently working- wants to find a job   She is working on an Tourist information centre manager at [employer]. Husband is Scientist, research (medical) at [employer]   Has a sister who lives in [place]. Does not have a great relationship with her mom.   Enjoys reading/puzzles.      Past Surgical History  Procedure Laterality Date  . Laparoscopy    . Dental surgery  07/2012    tooth implant    Family History   Problem Relation Age of Onset  . Anesthesia problems Neg Hx   . Cancer Mother 71    breast  . Osteoporosis Mother   . Thyroid disease Mother     hypothyroidism / grave's disease  . Cancer Sister 83    breast  . Stroke Father   . Hypertension Father   . Aneurysm Father     brain  . Cancer Paternal Aunt     breast  . Cancer Maternal Grandfather     ?bladder  . Cancer Cousin 42    breast  . Cancer Maternal Aunt     ?brain tumor  . Cancer Paternal Aunt     ovarian  . Cancer Cousin     brain tumors  . Alzheimer's disease Paternal Uncle   . Thyroid disease Maternal Grandmother     grave's disease    No Known Allergies  Current Outpatient Prescriptions on File Prior to Visit  Medication Sig Dispense Refill  . ALPRAZolam (XANAX) 0.5 MG tablet TAKE 1 TABLET 3 TIMES A DAY AS NEEDED  90 tablet  0  .  amLODipine (NORVASC) 5 MG tablet TAKE 1 TABLET (5 MG TOTAL) BY MOUTH DAILY.  30 tablet  2  . escitalopram (LEXAPRO) 10 MG tablet Take 2 tablets (20 mg total) by mouth daily.  60 tablet  2  . hydrochlorothiazide (HYDRODIURIL) 25 MG tablet Take 1 tablet (25 mg total) by mouth daily.  30 tablet  2  . varenicline (CHANTIX CONTINUING MONTH PAK) 1 MG tablet Take 1 tablet (1 mg total) by mouth 2 (two) times daily.  60 tablet  1   No current facility-administered medications on file prior to visit.    BP 160/78  Pulse 117  Temp(Src) 98.4 F (36.9 C) (Oral)  Resp 16  Ht 5\' 5"  (1.651 m)  Wt 155 lb (70.308 kg)  BMI 25.79 kg/m2  SpO2 98%  LMP 03/09/2013    Objective:   Physical Exam  Constitutional:  Somnolent.  Asleep on exam table upon entering.  HENT:  Head: Normocephalic and atraumatic.  Cardiovascular: Normal rate and regular rhythm.   Pulmonary/Chest: Effort normal and breath sounds normal. No respiratory distress. She has no wheezes. She has no rales. She exhibits no tenderness.  Musculoskeletal:  Bilateral LE strength is 5/5  R shoulder is without tenderness to  palpation or swelling.  No swelling noted of the right hand.          Assessment & Plan:

## 2013-04-10 ENCOUNTER — Telehealth: Payer: Self-pay | Admitting: Family

## 2013-04-10 ENCOUNTER — Ambulatory Visit (HOSPITAL_BASED_OUTPATIENT_CLINIC_OR_DEPARTMENT_OTHER)
Admission: RE | Admit: 2013-04-10 | Discharge: 2013-04-10 | Disposition: A | Discharge status: Home / Self Care | Payer: BC Managed Care – PPO | Source: Ambulatory Visit | Attending: Family | Admitting: Family

## 2013-04-10 DIAGNOSIS — M5126 Other intervertebral disc displacement, lumbar region: Secondary | ICD-10-CM | POA: Insufficient documentation

## 2013-04-10 DIAGNOSIS — M48061 Spinal stenosis, lumbar region without neurogenic claudication: Secondary | ICD-10-CM | POA: Insufficient documentation

## 2013-04-10 DIAGNOSIS — IMO0001 Reserved for inherently not codable concepts without codable children: Secondary | ICD-10-CM

## 2013-04-10 DIAGNOSIS — M545 Low back pain: Secondary | ICD-10-CM

## 2013-04-10 DIAGNOSIS — R209 Unspecified disturbances of skin sensation: Secondary | ICD-10-CM | POA: Insufficient documentation

## 2013-04-10 DIAGNOSIS — R29898 Other symptoms and signs involving the musculoskeletal system: Secondary | ICD-10-CM | POA: Insufficient documentation

## 2013-04-10 NOTE — Telephone Encounter (Signed)
Generally we use xanax.  She already has prescription and can take 2 tabs prior to procedure.

## 2013-04-10 NOTE — Telephone Encounter (Signed)
Attempted to reach pt, unable to leave message as call stops ringing and does not go into voicemail x 2 attempts.

## 2013-04-10 NOTE — Telephone Encounter (Signed)
Patient has MRI scheduled for tonight at 7:00pm. She would like to know if Melissa could call in a prescription for claustrophobia(sp?) to [facility] on [address]?

## 2013-04-10 NOTE — Assessment & Plan Note (Signed)
Deteriorated.  Continue prn xanax,lexapro add effexor. Pt advised to go to the ED if SI/HI and pt verbalizes understanding.

## 2013-04-10 NOTE — Assessment & Plan Note (Signed)
Recommended referral to sports medicine for shoulder and hand pain.

## 2013-04-10 NOTE — Assessment & Plan Note (Signed)
Recommended medrol dose pak, MRI for further evaluation. She is requesting vicodin. I advised her that I would like to try medrol dose pak first and that if her condition requires long term narcotics she will need referral to pain management.

## 2013-04-11 ENCOUNTER — Ambulatory Visit (INDEPENDENT_AMBULATORY_CARE_PROVIDER_SITE_OTHER): Payer: BC Managed Care – PPO | Admitting: Family Medicine

## 2013-04-11 ENCOUNTER — Encounter: Payer: Self-pay | Admitting: Family Medicine

## 2013-04-11 VITALS — BP 149/89 | HR 108 | Ht 64.0 in | Wt 155.0 lb

## 2013-04-11 DIAGNOSIS — M25519 Pain in unspecified shoulder: Secondary | ICD-10-CM

## 2013-04-11 DIAGNOSIS — M25511 Pain in right shoulder: Secondary | ICD-10-CM

## 2013-04-11 MED ORDER — CYCLOBENZAPRINE HCL 5 MG PO TABS: 5.0000 mg | ORAL_TABLET | Freq: Three times a day (TID) | ORAL | Status: DC | PRN

## 2013-04-11 NOTE — Telephone Encounter (Signed)
Rx send for flexeril to be used prn.

## 2013-04-11 NOTE — Telephone Encounter (Signed)
Notified pt and she is agreeable to proceed with PT. Pt reports that she has started medrol dose pack but is still in a lot of pain. Please advise if any further instructions?

## 2013-04-11 NOTE — Telephone Encounter (Signed)
MRI shows arthritis, mild bulging disc.  I recommend medrol dose pak as we discussed yesterday and referral to PT.

## 2013-04-11 NOTE — Telephone Encounter (Signed)
Left message to return my call.  

## 2013-04-11 NOTE — Patient Instructions (Signed)
You have rotator cuff impingement Try to avoid painful activities (overhead activities, lifting with extended arm) as much as possible. Aleve 2 tabs twice a day with food OR ibuprofen 3 tabs three times a day with food for pain and inflammation. Can take tylenol in addition to this. Subacromial injection may be beneficial to help with pain and to decrease inflammation - you were given this today. Consider physical therapy with transition to home exercise program. Do home exercise program with theraband and scapular stabilization exercises daily - these are very important for long term relief even if an injection was given. If not improving at follow-up we will consider further imaging, physical therapy and/or nitro patches. Follow up with me in 1 month.

## 2013-04-12 ENCOUNTER — Encounter: Payer: Self-pay | Admitting: Family Medicine

## 2013-04-12 ENCOUNTER — Telehealth: Payer: Self-pay

## 2013-04-12 DIAGNOSIS — G8929 Other chronic pain: Secondary | ICD-10-CM

## 2013-04-12 NOTE — Assessment & Plan Note (Signed)
most of symptoms consistent with rotator cuff impingement.  Start with home exercise program (Declined formal PT).  Subacromial injection given today.  NSAIDs, tylenol as needed.  Some of symptoms however not consistent with this or anatomic basis - symptoms, weakness would suggest multiple dermatome involvement only on right side.  Also has pain of right clavicle.  F/u in 1 month.  After informed written consent, patient was seated on exam table. Right shoulder was prepped with alcohol swab and utilizing posterior approach, patient's right subacromial space was injected with 3:1 marcaine: depomedrol. Patient tolerated the procedure well without immediate complications.

## 2013-04-12 NOTE — Telephone Encounter (Signed)
Patient called stating that she would not be able to proceed with MR spine due to the fact that her insurance will not cover unless she meets a 4,000$ deductible. Patient states that the steroid and Flexeril are not proving any relief.   Please advise,   Thanks!

## 2013-04-12 NOTE — Progress Notes (Signed)
Patient ID: Stephanie Montes, female   DOB: Oct 16, 1975, 37 y.o.   MRN: [id number]  PCP: Lemont Fillers., NP  Subjective:   HPI: Patient is a 37 y.o. female here for right shoulder pain.  Patient reports she's had right shoulder pain for several years. Past couple months has been really bad though. Describes pain around shoulder joint anterior through posterior. Tried tylenol, advil, ibuprofen without much benefit. All movements of shoulder hurt. Radiates down past elbow to forearm. + night pain. Describes diffuse dorsal bilateral hand pain and right 3rd digit numbness too. Pain worse with driving, picking anything up. Mother has rheumatoid arthritis.  Past Medical History  Diagnosis Date  . Ovarian cyst   . Hypertension   . Anxiety     Current Outpatient Prescriptions on File Prior to Visit  Medication Sig Dispense Refill  . ALPRAZolam (XANAX) 0.5 MG tablet TAKE 1 TABLET 3 TIMES A DAY AS NEEDED  90 tablet  0  . amLODipine (NORVASC) 5 MG tablet TAKE 1 TABLET (5 MG TOTAL) BY MOUTH DAILY.  30 tablet  2  . cyclobenzaprine (FLEXERIL) 5 MG tablet Take 1 tablet (5 mg total) by mouth 3 (three) times daily as needed for muscle spasms.  20 tablet  0  . escitalopram (LEXAPRO) 10 MG tablet Take 2 tablets (20 mg total) by mouth daily.  60 tablet  2  . hydrochlorothiazide (HYDRODIURIL) 25 MG tablet Take 1 tablet (25 mg total) by mouth daily.  30 tablet  2  . methylPREDNISolone (MEDROL DOSEPAK) 4 MG tablet follow package directions  21 tablet  0  . varenicline (CHANTIX CONTINUING MONTH PAK) 1 MG tablet Take 1 tablet (1 mg total) by mouth 2 (two) times daily.  60 tablet  1  . venlafaxine (EFFEXOR) 37.5 MG tablet One tablet by mouth once daily for 4 days, then may increase to 2 tabs once daily.  60 tablet  0   No current facility-administered medications on file prior to visit.    Past Surgical History  Procedure Laterality Date  . Laparoscopy    . Dental surgery  07/2012    tooth  implant    No Known Allergies  History   Social History  . Marital Status: Married    Spouse Name: N/A    Number of Children: N/A  . Years of Education: N/A   Occupational History  . Not on file.   Social History Main Topics  . Smoking status: Former Smoker    Quit date: 03/10/2013  . Smokeless tobacco: Never Used     Comment: 8-9 cigareetes daily  . Alcohol Use: No  . Drug Use: No  . Sexual Activity: Yes    Birth Control/ Protection: Condom   Other Topics Concern  . Not on file   Social History Narrative   Married, 10 years   Has a step son who is 5- she has raised him   Not currently working- wants to find a job   She is working on an Tourist information centre manager at [employer]. Husband is Scientist, research (medical) at [employer]   Has a sister who lives in [place]. Does not have a great relationship with her mom.   Enjoys reading/puzzles.      Family History  Problem Relation Age of Onset  . Anesthesia problems Neg Hx   . Cancer Mother 38    breast  . Osteoporosis Mother   . Thyroid disease Mother     hypothyroidism / grave's disease  . Hyperlipidemia Mother   .  Cancer Sister 90    breast  . Stroke Father   . Hypertension Father   . Aneurysm Father     brain  . Hyperlipidemia Father   . Sudden death Father   . Cancer Paternal Aunt     breast  . Cancer Maternal Grandfather     ?bladder  . Cancer Cousin 42    breast  . Cancer Maternal Aunt     ?brain tumor  . Cancer Paternal Aunt     ovarian  . Cancer Cousin     brain tumors  . Alzheimer's disease Paternal Uncle   . Thyroid disease Maternal Grandmother     grave's disease    BP 149/89  Pulse 108  Ht 5\' 4"  (1.626 m)  Wt 155 lb (70.308 kg)  BMI 26.59 kg/m2  LMP 03/09/2013  Review of Systems: See HPI above.    Objective:  Physical Exam:  Gen: NAD  Neck: No gross deformity, swelling, bruising. No TTP other than that noted below.  No midline/bony TTP. FROM neck without pain.. Strength 4/5 with finger abduction,  thumb opposition, elbow extension.  5/5 bilateral upper extremities otherwise. Sensation diminished to light touch 3rd digit. 2+ equal reflexes in triceps, biceps, brachioradialis tendons. Negative spurlings.  Right shoulder: No swelling, ecchymoses.  No gross deformity. Diffuse tenderness anterior, posterior shoulder to right trapezius and mid to distal clavicle. FROM with painful arc. Positive Hawkins, Neers. Negative Yergasons. Strength 5/5 with empty can and resisted internal/external rotation.  Pain with empty can and ER. Negative apprehension. NV intact distally.    Bilateral hands: No swelling, bruising, warmth, erythema. FROM all digits.  Assessment & Plan:  1. Right shoulder pain - most of symptoms consistent with rotator cuff impingement.  Start with home exercise program (Declined formal PT).  Subacromial injection given today.  NSAIDs, tylenol as needed.  Some of symptoms however not consistent with this or anatomic basis - symptoms, weakness would suggest multiple dermatome involvement only on right side.  Also has pain of right clavicle.  F/u in 1 month.  After informed written consent, patient was seated on exam table. Right shoulder was prepped with alcohol swab and utilizing posterior approach, patient's right subacromial space was injected with 3:1 marcaine: depomedrol. Patient tolerated the procedure well without immediate complications.

## 2013-04-12 NOTE — Telephone Encounter (Signed)
LMOM with contact name and number for return call RE: medication and further provider instructions/SLS  

## 2013-04-13 MED ORDER — MELOXICAM 7.5 MG PO TABS: 7.5000 mg | ORAL_TABLET | Freq: Every day | ORAL | Status: DC

## 2013-04-13 NOTE — Telephone Encounter (Signed)
Left message to return my call.  

## 2013-04-13 NOTE — Telephone Encounter (Signed)
I have sent rx for meloxicam and placed referral to pain clinic.  I do not treat chronic pain.  They will determine if she needs narcotics and if so, they will be the once to prescribe.

## 2013-04-16 NOTE — Telephone Encounter (Signed)
See additional phone note of 04/12/13, pt states she is unable to proceed with PT due to cost / deductible.

## 2013-04-16 NOTE — Telephone Encounter (Signed)
Notified pt of information below.  She states she has already tried the meloxicam and it caused her to have a "massive" headache. States she did try the flexeril we sent in and she is able to "muddle" through the day but the pain keeps her up at night. Pt is agreeable to pain management referral but wonders if there is anything else she can take in the mean time for her pain. Pt is already aware of the mild bulging discs from 04/09/13 MRI result.  Please advise.

## 2013-04-16 NOTE — Telephone Encounter (Signed)
Left message for pt to return my call.

## 2013-04-17 NOTE — Telephone Encounter (Signed)
LMOM with contact name and number for return call RE: pain mgt and further provider instructions/SLS

## 2013-04-17 NOTE — Telephone Encounter (Signed)
Patient informed, understood & agreed/SLS  

## 2013-04-17 NOTE — Telephone Encounter (Signed)
I have no further recommendations at this time other than PT when she is able and referral to pain management.

## 2013-04-18 ENCOUNTER — Ambulatory Visit: Payer: BC Managed Care – PPO | Admitting: Rehabilitation

## 2013-04-26 ENCOUNTER — Other Ambulatory Visit: Payer: Self-pay | Admitting: Family

## 2013-04-27 NOTE — Telephone Encounter (Signed)
eScribe request for refill on Alprazolam [One tab TID Prn] Last filled - 10.31.14, #90x0 Last AEX - 11.10.14 Next AEX - 1 Month [Appt scheduled 12.16.14] Please Advise/SLS

## 2013-04-27 NOTE — Telephone Encounter (Signed)
Rx request phoned to pharmacy/SLS  

## 2013-04-27 NOTE — Telephone Encounter (Signed)
OK to send 90 tabs, zero refills.  

## 2013-04-29 ENCOUNTER — Other Ambulatory Visit: Payer: Self-pay | Admitting: Family

## 2013-04-30 NOTE — Telephone Encounter (Signed)
eScribe request for refill on Lexapro Last filled - 08.26.14, #60x2 Last AEX - 11.10.14 Next AEX - 1 Month; appt scheduled 12.16.14 Refill sent per [facility] refill protocol/SLS

## 2013-05-01 ENCOUNTER — Telehealth: Payer: Self-pay | Admitting: Family

## 2013-05-01 MED ORDER — HYDROCHLOROTHIAZIDE 25 MG PO TABS: 25.0000 mg | ORAL_TABLET | Freq: Every day | ORAL | Status: DC

## 2013-05-01 NOTE — Telephone Encounter (Signed)
Refill sent.

## 2013-05-01 NOTE — Telephone Encounter (Signed)
refill-hydrochlorothiazide 25mg  tab. Take one tablet every day. Qty 30 last fill 10.31.14

## 2013-05-10 ENCOUNTER — Ambulatory Visit: Payer: BC Managed Care – PPO | Admitting: Family Medicine

## 2013-05-15 ENCOUNTER — Ambulatory Visit: Payer: BC Managed Care – PPO | Admitting: Family

## 2013-05-18 ENCOUNTER — Ambulatory Visit: Payer: BC Managed Care – PPO | Admitting: Family

## 2013-05-21 ENCOUNTER — Ambulatory Visit: Payer: BC Managed Care – PPO | Admitting: Family

## 2013-05-21 DIAGNOSIS — Z0289 Encounter for other administrative examinations: Secondary | ICD-10-CM

## 2013-05-23 ENCOUNTER — Other Ambulatory Visit: Payer: Self-pay | Admitting: Family

## 2013-05-23 NOTE — Telephone Encounter (Addendum)
Enough sent to get her to her apt. No additional refills until seen.

## 2013-05-23 NOTE — Telephone Encounter (Signed)
eScribe request for refill on Alprazolam Last filled - 11.27.14, #90x0 Last AEX - 11.10.14 Next AEX - 1 month [pt Canceled 12.16 & 12.19 appts and No Show on 12.22.14, has appt scheduled for 01.06.15] Please Advise/SLS

## 2013-06-04 ENCOUNTER — Other Ambulatory Visit: Payer: Self-pay | Admitting: Family

## 2013-06-04 NOTE — Telephone Encounter (Signed)
eScribe request for refill on Alprazolam Last filled - 12.24.14, #40x0 Last AEX - 11.10.14 Next AEX - 1 month [appt scheduled for 01.06.15] Please Advise/SLS

## 2013-06-05 ENCOUNTER — Other Ambulatory Visit: Payer: Self-pay | Admitting: Family

## 2013-06-05 ENCOUNTER — Encounter: Payer: Self-pay | Admitting: Family

## 2013-06-05 ENCOUNTER — Ambulatory Visit (INDEPENDENT_AMBULATORY_CARE_PROVIDER_SITE_OTHER): Payer: BC Managed Care – PPO | Admitting: Family

## 2013-06-05 VITALS — BP 160/88 | HR 110 | Temp 98.0°F | Resp 16 | Ht 65.0 in

## 2013-06-05 DIAGNOSIS — F3289 Other specified depressive episodes: Secondary | ICD-10-CM

## 2013-06-05 DIAGNOSIS — F411 Generalized anxiety disorder: Secondary | ICD-10-CM

## 2013-06-05 DIAGNOSIS — F329 Major depressive disorder, single episode, unspecified: Secondary | ICD-10-CM

## 2013-06-05 DIAGNOSIS — Z23 Encounter for immunization: Secondary | ICD-10-CM

## 2013-06-05 DIAGNOSIS — F32A Depression, unspecified: Secondary | ICD-10-CM | POA: Insufficient documentation

## 2013-06-05 DIAGNOSIS — I10 Essential (primary) hypertension: Secondary | ICD-10-CM

## 2013-06-05 MED ORDER — AMLODIPINE BESYLATE 10 MG PO TABS
10.0000 mg | ORAL_TABLET | Freq: Every day | ORAL | Status: DC
Start: 2013-06-05 — End: 2014-10-30

## 2013-06-05 MED ORDER — ESCITALOPRAM OXALATE 20 MG PO TABS: 20.0000 mg | ORAL_TABLET | Freq: Every day | ORAL | Status: DC

## 2013-06-05 MED ORDER — ALPRAZOLAM 0.5 MG PO TABS: ORAL_TABLET | ORAL | Status: DC

## 2013-06-05 NOTE — Progress Notes (Signed)
Pre visit review using our clinic review tool, if applicable. No additional management support is needed unless otherwise documented below in the visit note. 

## 2013-06-05 NOTE — Assessment & Plan Note (Signed)
Deteriorated.  Increase lexapro to 20mg .  Continue prn alprazolam, refill rx provided today. We reviewed the no show and cancellation policy today.
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
## 2013-06-05 NOTE — Assessment & Plan Note (Signed)
BP up today on amlodipine.  Increase from 5mg  to 10mg .

## 2013-06-05 NOTE — Telephone Encounter (Signed)
Alprazolam request addressed at 06/05/13 office visit.

## 2013-06-05 NOTE — Assessment & Plan Note (Signed)
stabe on effexor and lexapro.

## 2013-06-05 NOTE — Patient Instructions (Signed)
Please increase your lexapro to 20 mg once daily. Follow up in 1 month.

## 2013-06-06 ENCOUNTER — Telehealth: Payer: Self-pay | Admitting: Family

## 2013-06-06 NOTE — Telephone Encounter (Signed)
Relevant patient education assigned to patient using Emmi. ° °

## 2013-06-11 ENCOUNTER — Telehealth: Payer: Self-pay | Admitting: Family

## 2013-06-11 DIAGNOSIS — E559 Vitamin D deficiency, unspecified: Secondary | ICD-10-CM

## 2013-06-11 NOTE — Telephone Encounter (Signed)
Patient states that she has been taking vit b12, bone and mineral, vit d, vit c and would like to know if she can have labs done. She states that this is for her own personal records?

## 2013-06-12 NOTE — Telephone Encounter (Signed)
B12 level was checked and high 3 months ago.  I do not think she needs the supplement.  We can check vitamin D level (dx vitamin D deficiency). We generally do not check bone density on her age group or vitamin C levels.

## 2013-06-12 NOTE — Telephone Encounter (Signed)
Left message on home # to return my call. 

## 2013-06-13 NOTE — Telephone Encounter (Signed)
Patient returned phone call. Best # [phone]

## 2013-06-13 NOTE — Telephone Encounter (Signed)
Notified pt and she voices understanding.  Lab order entered for vitamin D level.

## 2013-06-14 ENCOUNTER — Encounter: Payer: Self-pay | Admitting: Physician Assistant

## 2013-06-14 ENCOUNTER — Ambulatory Visit (INDEPENDENT_AMBULATORY_CARE_PROVIDER_SITE_OTHER): Payer: BC Managed Care – PPO | Admitting: Physician Assistant

## 2013-06-14 VITALS — BP 162/84 | HR 118 | Resp 18 | Ht 65.0 in | Wt 155.0 lb

## 2013-06-14 DIAGNOSIS — J329 Chronic sinusitis, unspecified: Secondary | ICD-10-CM | POA: Insufficient documentation

## 2013-06-14 DIAGNOSIS — R5381 Other malaise: Secondary | ICD-10-CM

## 2013-06-14 DIAGNOSIS — H6690 Otitis media, unspecified, unspecified ear: Secondary | ICD-10-CM

## 2013-06-14 DIAGNOSIS — R5383 Other fatigue: Secondary | ICD-10-CM

## 2013-06-14 DIAGNOSIS — I10 Essential (primary) hypertension: Secondary | ICD-10-CM

## 2013-06-14 LAB — POCT INFLUENZA A/B
INFLUENZA A, POC: NEGATIVE
Influenza B, POC: NEGATIVE

## 2013-06-14 MED ORDER — HYDROCODONE-HOMATROPINE 5-1.5 MG/5ML PO SYRP: 5.0000 mL | ORAL_SOLUTION | Freq: Four times a day (QID) | ORAL | Status: DC | PRN

## 2013-06-14 MED ORDER — BENZONATATE 100 MG PO CAPS: 100.0000 mg | ORAL_CAPSULE | Freq: Two times a day (BID) | ORAL | Status: DC | PRN

## 2013-06-14 MED ORDER — AMOXICILLIN-POT CLAVULANATE 875-125 MG PO TABS: 1.0000 | ORAL_TABLET | Freq: Two times a day (BID) | ORAL | Status: DC

## 2013-06-14 NOTE — Assessment & Plan Note (Signed)
Patient tachycardic.  Vitals reviewed from previous visits, with patient slightly tachy.  Recent increase of CCB.  Patient endorses taking medication as prescribed.  Asymptomatic.  Patient to follow-up with PCP for possible medication changes.

## 2013-06-14 NOTE — Assessment & Plan Note (Signed)
Rx Augmentin

## 2013-06-14 NOTE — Progress Notes (Signed)
Pre visit review using our clinic review tool, if applicable. No additional management support is needed unless otherwise documented below in the visit note/SLS  

## 2013-06-14 NOTE — Patient Instructions (Addendum)
Increase fluid intake.  Rest.  Saline nasal spray. Plain Mucinex.  Tessalon Perles for cough. Humidifier in bedroom.  Take antibiotic as prescribed for ear infection.  Please call or return to clinic if symptoms are not improving.

## 2013-06-14 NOTE — Assessment & Plan Note (Signed)
Flu swab negative.  Rx Augmentin.  Increase fluid intake. Rest. Saline nasal spray. Plain Mucinex. Humidifier in bedroom.  Probiotic. Hycodan syrup given.  Return if symptoms not improving.

## 2013-06-14 NOTE — Progress Notes (Signed)
Patient presents to clinic today c/o c/o 3 days of head congestion, sinus pressure, sinus pain, ear pain, postnasal drip and fatigue x 3 days. Patient also endorses aches and sore throat.  Patient endorses sudden symptom onset. Is unsure of flu exposure.  Patient endorses fever but states she took a tylenol prior to visit.  Patient denies shortness of breath or wheezing.  Denies recent travel or sick contact.  Past Medical History  Diagnosis Date  . Ovarian cyst   . Hypertension   . Anxiety     Current Outpatient Prescriptions on File Prior to Visit  Medication Sig Dispense Refill  . ALPRAZolam (XANAX) 0.5 MG tablet TAKE 1 TABLET BY MOUTH 3 TIMES A DAY AS NEEDED  90 tablet  0  . amLODipine (NORVASC) 10 MG tablet Take 1 tablet (10 mg total) by mouth daily.  30 tablet  1  . Cholecalciferol (VITAMIN D PO) Take 1 tablet by mouth daily.      . cyanocobalamin 100 MCG tablet Take 100 mcg by mouth 2 (two) times daily.      Marland Kitchen escitalopram (LEXAPRO) 20 MG tablet Take 1 tablet (20 mg total) by mouth daily.  30 tablet  1  . hydrochlorothiazide (HYDRODIURIL) 25 MG tablet Take 1 tablet (25 mg total) by mouth daily.  30 tablet  2  . HYDROcodone-acetaminophen (NORCO) 10-325 MG per tablet Take 1 tablet by mouth as needed.      . Multiple Vitamin (MULTIVITAMIN) capsule Take 1 capsule by mouth daily.       No current facility-administered medications on file prior to visit.    No Known Allergies  Family History  Problem Relation Age of Onset  . Anesthesia problems Neg Hx   . Cancer Mother 18    breast  . Osteoporosis Mother   . Thyroid disease Mother     hypothyroidism / grave's disease  . Hyperlipidemia Mother   . Cancer Sister 4    breast  . Stroke Father   . Hypertension Father   . Aneurysm Father     brain  . Hyperlipidemia Father   . Sudden death Father   . Cancer Paternal Aunt     breast  . Cancer Maternal Grandfather     ?bladder  . Cancer Cousin 42    breast  . Cancer Maternal  Aunt     ?brain tumor  . Cancer Paternal Aunt     ovarian  . Cancer Cousin     brain tumors  . Alzheimer's disease Paternal Uncle   . Thyroid disease Maternal Grandmother     grave's disease    History   Social History  . Marital Status: Married    Spouse Name: N/A    Number of Children: N/A  . Years of Education: N/A   Social History Main Topics  . Smoking status: Former Smoker    Quit date: 03/10/2013  . Smokeless tobacco: Never Used     Comment: 8-9 cigareetes daily  . Alcohol Use: No  . Drug Use: No  . Sexual Activity: Yes    Birth Control/ Protection: Condom   Other Topics Concern  . 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  Does not have a great relationship with her mom.   Enjoys reading/puzzles.     Review of Systems - See HPI.  All other ROS are negative.  Filed Vitals:   06/14/13 1324  BP: 162/84  Pulse: 118  Resp: 18   Physical Exam  Vitals reviewed. Constitutional: She is oriented to person, place, and time and well-developed, well-nourished, and in no distress.  HENT:  Head: Normocephalic and atraumatic.  Right Ear: External ear normal.  Left Ear: External ear normal.  Nose: Nose normal.  Mouth/Throat: Oropharynx is clear and moist. No oropharyngeal exudate.  R TM within normal limits.  L TM erythematous and bulging.  Tenderness to percussion of sinuses noted on examination.  Eyes: Conjunctivae are normal. Pupils are equal, round, and reactive to light.  Neck: Neck supple.  Cardiovascular: Regular rhythm, normal heart sounds and intact distal pulses.   Tachycardic.  Pulmonary/Chest: Effort normal and breath sounds normal. No respiratory distress. She has no wheezes. She has no rales. She exhibits no tenderness.   Lymphadenopathy:    She has no cervical adenopathy.  Neurological: She is alert and oriented to person, place, and time.  Skin: Skin is warm and dry. No rash noted.  Psychiatric: Affect normal.   Recent Results (from the past 2160 hour(s))  POCT INFLUENZA A/B     Status: None   Collection Time    06/14/13  2:14 PM      Result Value Range   Influenza A, POC Negative     Influenza B, POC Negative     Assessment/Plan: No problem-specific assessment & plan notes found for this encounter.

## 2013-06-20 ENCOUNTER — Telehealth: Payer: Self-pay | Admitting: *Deleted

## 2013-06-20 NOTE — Telephone Encounter (Signed)
Pt left message on 06/19/13 requesting referral to GYN. Attempted to reach pt for clarification. Is this for routine gyn exam or is pt experiencing symptoms?

## 2013-06-26 NOTE — Telephone Encounter (Signed)
Left the msg for the pt to call back regard to her referral to gyn.

## 2013-06-27 NOTE — Telephone Encounter (Signed)
Left detailed message on cell# to call with clarification on need for referral.

## 2013-07-02 ENCOUNTER — Other Ambulatory Visit: Payer: Self-pay | Admitting: Family

## 2013-07-02 NOTE — Telephone Encounter (Signed)
eScribe request from [facility] for refill on Alprazolam Last filled - 01.06.15, #90x0 [PRINT] eScribe request from [facility] for refill on Effexor Last filled - 11.10.14, #60x0 [Unsure of d/c at 01.15.15 OV w/Cody?] Last AEX - 01.06.15 [PCP] Next AEX - 1 Month  Gilmore Laroche, can you check on these; Thanks/SLS

## 2013-07-03 NOTE — Telephone Encounter (Signed)
Pt left message today stating she just received my previous messages and states she will discuss this at her follow up on 07/11/13.

## 2013-07-03 NOTE — Telephone Encounter (Signed)
Last refill of effexor was 06/09/12, #60. Last alprazolam 06/05/13, #90.  Pt has follow up on 07/11/13.  Please advise.

## 2013-07-04 ENCOUNTER — Encounter: Payer: Self-pay | Admitting: Family

## 2013-07-04 ENCOUNTER — Ambulatory Visit (INDEPENDENT_AMBULATORY_CARE_PROVIDER_SITE_OTHER): Payer: BC Managed Care – PPO | Admitting: Family

## 2013-07-04 VITALS — BP 140/84 | HR 100 | Temp 97.9°F | Resp 18 | Ht 65.0 in | Wt 153.0 lb

## 2013-07-04 DIAGNOSIS — J329 Chronic sinusitis, unspecified: Secondary | ICD-10-CM

## 2013-07-04 DIAGNOSIS — J3489 Other specified disorders of nose and nasal sinuses: Secondary | ICD-10-CM | POA: Insufficient documentation

## 2013-07-04 MED ORDER — AMOXICILLIN-POT CLAVULANATE 875-125 MG PO TABS: 1.0000 | ORAL_TABLET | Freq: Two times a day (BID) | ORAL | Status: DC

## 2013-07-04 MED ORDER — VALACYCLOVIR HCL 1 G PO TABS: ORAL_TABLET | ORAL | Status: DC

## 2013-07-04 MED ORDER — ALPRAZOLAM 0.5 MG PO TABS: ORAL_TABLET | ORAL | Status: DC

## 2013-07-04 NOTE — Progress Notes (Signed)
Subjective:    Patient ID: Stephanie Montes, female    DOB: [birth year], 38 y.o.   MRN: [id number]  HPI  [name] is a 38 yr old female who presents today with chief complaint of nasal congestion.   Nasal congestion started 3 days ago. Right sided facial pressure, green nasal drainage.  + associated cough, chills/sweats, joint pain- knees/elbows hurt. Has used tylenol flu/cold and alka selzer tea, no significant improvement with these measures.    Reports R nare has had sore for >1 week.   Was recently treated for otitis media/sinusitus on 1/15 by [facility] with augmentin and symptoms resolved.     Review of Systems See HPI  Past Medical History  Diagnosis Date  . Ovarian cyst   . Hypertension   . Anxiety     History   Social History  . Marital Status: Married    Spouse Name: N/A    Number of Children: N/A  . Years of Education: N/A   Occupational History  . Not on file.   Social History Main Topics  . Smoking status: Former Smoker    Quit date: 03/10/2013  . Smokeless tobacco: Never Used     Comment: 8-9 cigareetes daily  . Alcohol Use: No  . Drug Use: No  . Sexual Activity: Yes    Birth Control/ Protection: Condom   Other Topics Concern  . Not on file   Social History Narrative   Married, 10 years   Has a step son who is 7- she has raised him   Not currently working- wants to find a job   She is working on an Biomedical scientist at [employer]. Husband is Surveyor, mining at [employer]   Has a sister who lives in [place]. Does not have a great relationship with her mom.   Enjoys reading/puzzles.      Past Surgical History  Procedure Laterality Date  . Laparoscopy    . Dental surgery  07/2012    tooth implant    Family History  Problem Relation Age of Onset  . Anesthesia problems Neg Hx   . Cancer Mother 87    breast  . Osteoporosis Mother   . Thyroid disease Mother     hypothyroidism / grave's disease  . Hyperlipidemia Mother   . Cancer Sister 65   breast  . Stroke Father   . Hypertension Father   . Aneurysm Father     brain  . Hyperlipidemia Father   . Sudden death Father   . Cancer Paternal Aunt     breast  . Cancer Maternal Grandfather     ?bladder  . Cancer Cousin 42    breast  . Cancer Maternal Aunt     ?brain tumor  . Cancer Paternal Aunt     ovarian  . Cancer Cousin     brain tumors  . Alzheimer's disease Paternal Uncle   . Thyroid disease Maternal Grandmother     grave's disease    No Known Allergies  Current Outpatient Prescriptions on File Prior to Visit  Medication Sig Dispense Refill  . ALPRAZolam (XANAX) 0.5 MG tablet TAKE 1 TABLET BY MOUTH 3 TIMES A DAY AS NEEDED  90 tablet  0  . amLODipine (NORVASC) 10 MG tablet Take 1 tablet (10 mg total) by mouth daily.  30 tablet  1  . Cholecalciferol (VITAMIN D PO) Take 1 tablet by mouth daily.      . cyanocobalamin 100 MCG tablet Take 100 mcg  by mouth 2 (two) times daily.      Marland Kitchen escitalopram (LEXAPRO) 20 MG tablet Take 1 tablet (20 mg total) by mouth daily.  30 tablet  1  . hydrochlorothiazide (HYDRODIURIL) 25 MG tablet Take 1 tablet (25 mg total) by mouth daily.  30 tablet  2  . HYDROcodone-acetaminophen (NORCO) 10-325 MG per tablet Take 1 tablet by mouth as needed.      . Multiple Vitamin (MULTIVITAMIN) capsule Take 1 capsule by mouth daily.      Marland Kitchen venlafaxine (EFFEXOR) 37.5 MG tablet Take 2 tabs once daily.       No current facility-administered medications on file prior to visit.    BP 140/84  Pulse 100  Temp(Src) 97.9 F (36.6 C) (Oral)  Resp 18  Ht 5\' 5"  (1.651 m)  Wt 153 lb (69.4 kg)  BMI 25.46 kg/m2  SpO2 99%  LMP 06/14/2013       Objective:   Physical Exam  Constitutional: She is oriented to person, place, and time. She appears well-developed and well-nourished. No distress.  HENT:  Head: Normocephalic and atraumatic.  Cardiovascular: Normal rate and regular rhythm.   No murmur heard. Pulmonary/Chest: Effort normal and breath sounds  normal. No respiratory distress. She has no wheezes. She has no rales. She exhibits no tenderness.  Neurological: She is alert and oriented to person, place, and time.  Psychiatric: She has a normal mood and affect. Her behavior is normal. Judgment and thought content normal.          Assessment & Plan:

## 2013-07-04 NOTE — Patient Instructions (Signed)
Please call if symptoms worsen, or if not improved in 2-3 days.

## 2013-07-04 NOTE — Assessment & Plan Note (Signed)
Will rx with valtrex.  Note provided for work.

## 2013-07-04 NOTE — Progress Notes (Signed)
Pre visit review using our clinic review tool, if applicable. No additional management support is needed unless otherwise documented below in the visit note. 

## 2013-07-04 NOTE — Assessment & Plan Note (Signed)
Will rx with augmentin.  

## 2013-07-06 ENCOUNTER — Ambulatory Visit (HOSPITAL_BASED_OUTPATIENT_CLINIC_OR_DEPARTMENT_OTHER)
Admission: RE | Admit: 2013-07-06 | Discharge: 2013-07-06 | Disposition: A | Discharge status: Home / Self Care | Payer: BC Managed Care – PPO | Source: Ambulatory Visit | Attending: Family | Admitting: Family

## 2013-07-06 ENCOUNTER — Telehealth: Payer: Self-pay | Admitting: Family

## 2013-07-06 DIAGNOSIS — R05 Cough: Secondary | ICD-10-CM | POA: Insufficient documentation

## 2013-07-06 DIAGNOSIS — R0989 Other specified symptoms and signs involving the circulatory and respiratory systems: Secondary | ICD-10-CM | POA: Insufficient documentation

## 2013-07-06 DIAGNOSIS — R059 Cough, unspecified: Secondary | ICD-10-CM | POA: Insufficient documentation

## 2013-07-06 MED ORDER — ALBUTEROL SULFATE HFA 108 (90 BASE) MCG/ACT IN AERS: 2.0000 | INHALATION_SPRAY | Freq: Four times a day (QID) | RESPIRATORY_TRACT | Status: DC | PRN

## 2013-07-06 NOTE — Telephone Encounter (Signed)
Notified pt and she voices understanding. 

## 2013-07-06 NOTE — Telephone Encounter (Signed)
She is not getting any better and her cough is worse.  The tessalon pearls are not helping

## 2013-07-06 NOTE — Telephone Encounter (Signed)
Left message to return my call.  

## 2013-07-06 NOTE — Telephone Encounter (Signed)
Patient states that she is out of work today and will need another work note for today's date.

## 2013-07-06 NOTE — Telephone Encounter (Signed)
OK to give her a note today, but I would also like for her to complete a CXR today.  I will add albuterol to see if this helps her cough.  If not better by Monday, she needs re-evaluation in the office.

## 2013-07-07 ENCOUNTER — Telehealth: Payer: Self-pay | Admitting: Family

## 2013-07-07 DIAGNOSIS — R911 Solitary pulmonary nodule: Secondary | ICD-10-CM

## 2013-07-07 NOTE — Telephone Encounter (Signed)
Please call pt and let her know that her CXR is negative for pneumonia. Does not a ? Nodule in the left upper lung. Radiologist is recommending a CT of the chest to further evaluate.  Order pended.

## 2013-07-09 NOTE — Telephone Encounter (Signed)
Left message for pt to return my call.

## 2013-07-09 NOTE — Telephone Encounter (Signed)
Valtrex was sent on 2/4 for her nose.

## 2013-07-09 NOTE — Telephone Encounter (Signed)
Left message on voicemail for pt to return my call.

## 2013-07-09 NOTE — Telephone Encounter (Signed)
Notified pt. She states cough is about the same, no better. States her cough feels deeper. Still having coughing fits and feels very fatigued. Pt is agreeable to proceed with CT. Please advise if there are any further recommendations re: pt's cough?

## 2013-07-09 NOTE — Telephone Encounter (Signed)
Notified pt.  She states the sore in her nose is not any better. She has been applying antibiotic ointment to it since her last visit. She states that you mentioned taking something by mouth to help clear it up and she wants to proceed with that?  Please advise.

## 2013-07-09 NOTE — Telephone Encounter (Signed)
Notified pt, she did not remember getting this Rx on 07/04/13.  Called pharmacy and they verified that rx was picked up on 07/04/13 at 2:30pm. Notified pt and she voices understanding and will check her medications again at home.

## 2013-07-09 NOTE — Telephone Encounter (Signed)
No further recommendations re: cough at this time, other than to use albuterol every 6 hours.  Keep appointment on 2/11.

## 2013-07-10 ENCOUNTER — Ambulatory Visit (HOSPITAL_BASED_OUTPATIENT_CLINIC_OR_DEPARTMENT_OTHER): Payer: BC Managed Care – PPO

## 2013-07-11 ENCOUNTER — Ambulatory Visit (INDEPENDENT_AMBULATORY_CARE_PROVIDER_SITE_OTHER): Payer: BC Managed Care – PPO | Admitting: Family

## 2013-07-11 ENCOUNTER — Encounter: Payer: Self-pay | Admitting: Family

## 2013-07-11 ENCOUNTER — Ambulatory Visit (HOSPITAL_BASED_OUTPATIENT_CLINIC_OR_DEPARTMENT_OTHER): Admission: RE | Admit: 2013-07-11 | Payer: BC Managed Care – PPO | Source: Ambulatory Visit

## 2013-07-11 ENCOUNTER — Telehealth: Payer: Self-pay | Admitting: Family

## 2013-07-11 VITALS — BP 150/88 | HR 92 | Temp 98.2°F | Resp 16 | Ht 65.0 in | Wt 153.1 lb

## 2013-07-11 DIAGNOSIS — J329 Chronic sinusitis, unspecified: Secondary | ICD-10-CM

## 2013-07-11 DIAGNOSIS — F3289 Other specified depressive episodes: Secondary | ICD-10-CM

## 2013-07-11 DIAGNOSIS — J3489 Other specified disorders of nose and nasal sinuses: Secondary | ICD-10-CM

## 2013-07-11 DIAGNOSIS — F329 Major depressive disorder, single episode, unspecified: Secondary | ICD-10-CM

## 2013-07-11 DIAGNOSIS — F411 Generalized anxiety disorder: Secondary | ICD-10-CM

## 2013-07-11 DIAGNOSIS — F32A Depression, unspecified: Secondary | ICD-10-CM

## 2013-07-11 DIAGNOSIS — I10 Essential (primary) hypertension: Secondary | ICD-10-CM

## 2013-07-11 MED ORDER — FLUTICASONE PROPIONATE 50 MCG/ACT NA SUSP: 2.0000 | Freq: Every day | NASAL | Status: DC

## 2013-07-11 MED ORDER — METOPROLOL SUCCINATE ER 25 MG PO TB24: 25.0000 mg | ORAL_TABLET | Freq: Every day | ORAL | Status: DC

## 2013-07-11 NOTE — Patient Instructions (Signed)
Complete antibiotics. Start flonase and claritin (loratidine) Complete CT chest this evening as scheduled. Call if cough/nasal congestion worsens, if you develop fever, or if you are not improved in 1 week.

## 2013-07-11 NOTE — Progress Notes (Signed)
Pre visit review using our clinic review tool, if applicable. No additional management support is needed unless otherwise documented below in the visit note. 

## 2013-07-11 NOTE — Assessment & Plan Note (Signed)
Reasonable control with xanax and effexor. Continue same.

## 2013-07-11 NOTE — Assessment & Plan Note (Signed)
Improving. She has several days of augmentin left.  In the meantime, I have recommended that she add flonase and claritin. I think sinus drainage is contributing to cough.

## 2013-07-11 NOTE — Progress Notes (Signed)
Subjective:    Patient ID: Stephanie Montes, female    DOB: 1975-12-03, 38 y.o.   MRN: [id number]  HPI  Stephanie Montes is a 38 yr old female who presents today for follow up.  1) Anxiety/Depression- she has since gotten a job.  Feels like in the last few weeks she has had more better days then terrible ones. She continues xanax 3-4 times a day.  Has had several panic attacks.    2) Cough/nasal congestion- was given rx for augmentin on 07/04/12.  Still having some nasal congestion. Feels like the cough is getting "deeper."  Recent CXR was negative for cough but did note a ? L side nodule.  A CT chest is scheduled for this evening to further evaluate abnormality noted on CXR.   3) HTN- current meds include hctz and amlodipine.  BP Readings from Last 3 Encounters:  07/11/13 150/88  07/04/13 140/84  06/14/13 162/84    Review of Systems See HPI  Past Medical History  Diagnosis Date  . Ovarian cyst   . Hypertension   . Anxiety     History   Social History  . Marital Status: Married    Spouse Name: N/A    Number of Children: N/A  . Years of Education: N/A   Occupational History  . Not on file.   Social History Main Topics  . Smoking status: Former Smoker    Quit date: 03/10/2013  . Smokeless tobacco: Never Used     Comment: 8-9 cigareetes daily  . Alcohol Use: No  . Drug Use: No  . Sexual Activity: Yes    Birth Control/ Protection: Condom   Other Topics Concern  . Not on file   Social History Narrative   Married, 10 years   Has a step son who is 36- she has raised him   Not currently working- wants to find a job   She is working on an Biomedical scientist at [employer]. Husband is Surveyor, mining at [employer]   Has a sister who lives in [place]. Does not have a great relationship with her mom.   Enjoys reading/puzzles.      Past Surgical History  Procedure Laterality Date  . Laparoscopy    . Dental surgery  07/2012    tooth implant    Family History  Problem Relation  Age of Onset  . Anesthesia problems Neg Hx   . Cancer Mother 49    breast  . Osteoporosis Mother   . Thyroid disease Mother     hypothyroidism / grave's disease  . Hyperlipidemia Mother   . Cancer Sister 36    breast  . Stroke Father   . Hypertension Father   . Aneurysm Father     brain  . Hyperlipidemia Father   . Sudden death Father   . Cancer Paternal Aunt     breast  . Cancer Maternal Grandfather     ?bladder  . Cancer Cousin 42    breast  . Cancer Maternal Aunt     ?brain tumor  . Cancer Paternal Aunt     ovarian  . Cancer Cousin     brain tumors  . Alzheimer's disease Paternal Uncle   . Thyroid disease Maternal Grandmother     grave's disease    No Known Allergies  Current Outpatient Prescriptions on File Prior to Visit  Medication Sig Dispense Refill  . albuterol (PROVENTIL HFA;VENTOLIN HFA) 108 (90 BASE) MCG/ACT inhaler Inhale 2 puffs into the lungs  every 6 (six) hours as needed for wheezing or shortness of breath.  1 Inhaler  0  . ALPRAZolam (XANAX) 0.5 MG tablet TAKE 1 TABLET BY MOUTH 3 TIMES A DAY AS NEEDED  90 tablet  0  . amLODipine (NORVASC) 10 MG tablet Take 1 tablet (10 mg total) by mouth daily.  30 tablet  1  . amoxicillin-clavulanate (AUGMENTIN) 875-125 MG per tablet Take 1 tablet by mouth 2 (two) times daily.  20 tablet  0  . Cholecalciferol (VITAMIN D PO) Take 1 tablet by mouth daily.      . cyanocobalamin 100 MCG tablet Take 100 mcg by mouth 2 (two) times daily.      Marland Kitchen escitalopram (LEXAPRO) 20 MG tablet Take 1 tablet (20 mg total) by mouth daily.  30 tablet  1  . hydrochlorothiazide (HYDRODIURIL) 25 MG tablet Take 1 tablet (25 mg total) by mouth daily.  30 tablet  2  . HYDROcodone-acetaminophen (NORCO) 10-325 MG per tablet Take 1 tablet by mouth as needed.      . Multiple Vitamin (MULTIVITAMIN) capsule Take 1 capsule by mouth daily.      . valACYclovir (VALTREX) 1000 MG tablet 2 tabs by mouth now, repeat in 12 hours  4 tablet  1  . venlafaxine  (EFFEXOR) 37.5 MG tablet Take 2 tabs once daily.      Marland Kitchen venlafaxine (EFFEXOR) 37.5 MG tablet TAKE 1 TABLET BY MOUTH EVERY DAY FOR 4 DAYS, THEN INCREASE TO 2 TABLETS DAILY THEREAFTER  60 tablet  2   No current facility-administered medications on file prior to visit.    BP 150/88  Pulse 92  Temp(Src) 98.2 F (36.8 C) (Oral)  Resp 16  Ht 5\' 5"  (1.651 m)  Wt 153 lb 1.9 oz (69.455 kg)  BMI 25.48 kg/m2  SpO2 99%  LMP 06/14/2013       Objective:   Physical Exam  Constitutional: She is oriented to person, place, and time. She appears well-developed and well-nourished. No distress.  HENT:  Head: Normocephalic and atraumatic.  Right Ear: Tympanic membrane and ear canal normal.  Left Ear: Tympanic membrane and ear canal normal.  Mouth/Throat: No oropharyngeal exudate, posterior oropharyngeal edema or posterior oropharyngeal erythema.  Cardiovascular: Normal rate and regular rhythm.   No murmur heard. Pulmonary/Chest: Effort normal and breath sounds normal. No respiratory distress. She has no wheezes. She has no rales. She exhibits no tenderness.  Neurological: She is alert and oriented to person, place, and time.  Skin: Skin is warm and dry.  Psychiatric: She has a normal mood and affect. Her behavior is normal. Judgment and thought content normal.          Assessment & Plan:

## 2013-07-11 NOTE — Assessment & Plan Note (Signed)
BP is elevated.  On HCTZ and amlodipine.  Will give trial of toprol xl as well for BP control.

## 2013-07-11 NOTE — Assessment & Plan Note (Signed)
Stable on effexor, continue same. 

## 2013-07-11 NOTE — Telephone Encounter (Signed)
Please call pt and let her know that I reviewed her blood pressure and I would like for her to add toprol xl once daily for BP control. Follow up in 1 month.

## 2013-07-11 NOTE — Assessment & Plan Note (Signed)
Improving.

## 2013-07-12 ENCOUNTER — Telehealth: Payer: Self-pay | Admitting: Family

## 2013-07-12 NOTE — Telephone Encounter (Signed)
Relevant patient education assigned to patient using Emmi. ° °

## 2013-07-18 NOTE — Telephone Encounter (Signed)
Left a message for patient to return our call.

## 2013-07-20 ENCOUNTER — Ambulatory Visit (HOSPITAL_BASED_OUTPATIENT_CLINIC_OR_DEPARTMENT_OTHER)
Admission: RE | Admit: 2013-07-20 | Discharge: 2013-07-20 | Disposition: A | Discharge status: Home / Self Care | Payer: BC Managed Care – PPO | Source: Ambulatory Visit | Attending: Family | Admitting: Family

## 2013-07-20 DIAGNOSIS — R911 Solitary pulmonary nodule: Secondary | ICD-10-CM | POA: Insufficient documentation

## 2013-07-20 DIAGNOSIS — I700 Atherosclerosis of aorta: Secondary | ICD-10-CM | POA: Insufficient documentation

## 2013-07-20 DIAGNOSIS — K449 Diaphragmatic hernia without obstruction or gangrene: Secondary | ICD-10-CM | POA: Insufficient documentation

## 2013-07-20 DIAGNOSIS — K219 Gastro-esophageal reflux disease without esophagitis: Secondary | ICD-10-CM | POA: Insufficient documentation

## 2013-07-20 NOTE — Telephone Encounter (Signed)
Left detailed message on voicemail and to call Monday to arrange 1 month follow up.

## 2013-07-23 NOTE — Telephone Encounter (Signed)
Pt called back and voices understanding. Follow up scheduled for 08/20/13 at 5pm.

## 2013-07-31 ENCOUNTER — Telehealth: Payer: Self-pay | Admitting: Family

## 2013-07-31 MED ORDER — ALPRAZOLAM 0.5 MG PO TABS: ORAL_TABLET | ORAL | Status: DC

## 2013-07-31 NOTE — Telephone Encounter (Signed)
Last rx printed 07/04/13. Pt has f/u 08/20/13.  Rx printed and forwarded to Provider for signature.

## 2013-07-31 NOTE — Telephone Encounter (Signed)
Refill alprazolam 

## 2013-08-01 NOTE — Telephone Encounter (Signed)
Rx faxed to pharmacy  

## 2013-08-10 ENCOUNTER — Ambulatory Visit: Payer: BC Managed Care – PPO | Admitting: Family

## 2013-08-14 ENCOUNTER — Ambulatory Visit: Payer: BC Managed Care – PPO | Admitting: Family

## 2013-08-20 ENCOUNTER — Ambulatory Visit (INDEPENDENT_AMBULATORY_CARE_PROVIDER_SITE_OTHER): Payer: BC Managed Care – PPO | Admitting: Family

## 2013-08-20 ENCOUNTER — Encounter: Payer: Self-pay | Admitting: Family

## 2013-08-20 VITALS — BP 142/72 | HR 88 | Temp 98.3°F | Resp 16 | Ht 65.0 in | Wt 149.0 lb

## 2013-08-20 DIAGNOSIS — I10 Essential (primary) hypertension: Secondary | ICD-10-CM

## 2013-08-20 MED ORDER — METOPROLOL SUCCINATE ER 50 MG PO TB24: 50.0000 mg | ORAL_TABLET | Freq: Every day | ORAL | Status: DC

## 2013-08-20 NOTE — Patient Instructions (Signed)
Please increase toprol xl to 50mg  once daily. Follow up in 1 month.

## 2013-08-20 NOTE — Progress Notes (Signed)
Pre visit review using our clinic review tool, if applicable. No additional management support is needed unless otherwise documented below in the visit note.,  

## 2013-08-20 NOTE — Progress Notes (Signed)
Subjective:    Patient ID: Stephanie Montes, female    DOB: 02-Nov-1975, 38 y.o.   MRN: [id number]  HPI  Stephanie Montes is a 38 yr old female who presents today for follow up of her hypertension. Last visit toprol xl was added to her regimen of hctz and amlodipine. Notes dry mouth, but denies cp/sob or swelling.   BP Readings from Last 3 Encounters:  08/20/13 142/72  07/11/13 150/88  07/04/13 140/84    Review of Systems    see HPI  Past Medical History  Diagnosis Date  . Ovarian cyst   . Hypertension   . Anxiety     History   Social History  . Marital Status: Married    Spouse Name: N/A    Number of Children: N/A  . Years of Education: N/A   Occupational History  . Not on file.   Social History Main Topics  . Smoking status: Former Smoker    Quit date: 03/10/2013  . Smokeless tobacco: Never Used     Comment: 8-9 cigareetes daily  . Alcohol Use: No  . Drug Use: No  . Sexual Activity: Yes    Birth Control/ Protection: Condom   Other Topics Concern  . Not on file   Social History Narrative   Married, 10 years   Has a step son who is 65- she has raised him   Not currently working- wants to find a job   She is working on an Biomedical scientist at [employer]. Husband is Surveyor, mining at [employer]   Has a sister who lives in [place]. Does not have a great relationship with her mom.   Enjoys reading/puzzles.      Past Surgical History  Procedure Laterality Date  . Laparoscopy    . Dental surgery  07/2012    tooth implant    Family History  Problem Relation Age of Onset  . Anesthesia problems Neg Hx   . Cancer Mother 86    breast  . Osteoporosis Mother   . Thyroid disease Mother     hypothyroidism / grave's disease  . Hyperlipidemia Mother   . Cancer Sister 3    breast  . Stroke Father   . Hypertension Father   . Aneurysm Father     brain  . Hyperlipidemia Father   . Sudden death Father   . Cancer Paternal Aunt     breast  . Cancer Maternal Grandfather      ?bladder  . Cancer Cousin 42    breast  . Cancer Maternal Aunt     ?brain tumor  . Cancer Paternal Aunt     ovarian  . Cancer Cousin     brain tumors  . Alzheimer's disease Paternal Uncle   . Thyroid disease Maternal Grandmother     grave's disease    No Known Allergies  Current Outpatient Prescriptions on File Prior to Visit  Medication Sig Dispense Refill  . ALPRAZolam (XANAX) 0.5 MG tablet TAKE 1 TABLET BY MOUTH 3 TIMES A DAY AS NEEDED  90 tablet  0  . amLODipine (NORVASC) 10 MG tablet Take 1 tablet (10 mg total) by mouth daily.  30 tablet  1  . Cholecalciferol (VITAMIN D PO) Take 1 tablet by mouth daily.      . cyanocobalamin 100 MCG tablet Take 100 mcg by mouth 2 (two) times daily.      Marland Kitchen escitalopram (LEXAPRO) 20 MG tablet Take 1 tablet (20 mg total) by mouth daily.  30 tablet  1  . hydrochlorothiazide (HYDRODIURIL) 25 MG tablet Take 1 tablet (25 mg total) by mouth daily.  30 tablet  2  . HYDROcodone-acetaminophen (NORCO) 10-325 MG per tablet Take 1 tablet by mouth as needed.      . metoprolol succinate (TOPROL-XL) 25 MG 24 hr tablet Take 1 tablet (25 mg total) by mouth daily.  30 tablet  1  . Multiple Vitamin (MULTIVITAMIN) capsule Take 1 capsule by mouth daily.      Marland Kitchen venlafaxine (EFFEXOR) 37.5 MG tablet TAKE 1 TABLET BY MOUTH EVERY DAY FOR 4 DAYS, THEN INCREASE TO 2 TABLETS DAILY THEREAFTER  60 tablet  2   No current facility-administered medications on file prior to visit.    BP 142/72  Pulse 88  Temp(Src) 98.3 F (36.8 C) (Oral)  Resp 16  Ht 5\' 5"  (1.651 m)  Wt 149 lb (67.586 kg)  BMI 24.79 kg/m2  SpO2 99%    Objective:   Physical Exam  Constitutional: She is oriented to person, place, and time. She appears well-developed and well-nourished. No distress.  Cardiovascular: Normal rate and regular rhythm.   No murmur heard. Pulmonary/Chest: Effort normal and breath sounds normal. No respiratory distress. She has no wheezes. She has no rales. She exhibits  no tenderness.  Neurological: She is alert and oriented to person, place, and time.  Psychiatric: She has a normal mood and affect. Her behavior is normal. Judgment and thought content normal.          Assessment & Plan:

## 2013-08-26 NOTE — Assessment & Plan Note (Signed)
BP remains elevated.  Will continue amlodipine. Increase toprol xl from 25mg  to 50mg  daily.

## 2013-08-27 ENCOUNTER — Other Ambulatory Visit: Payer: Self-pay | Admitting: Family

## 2013-08-27 NOTE — Telephone Encounter (Signed)
Rx called to [facility] at [facility], #90 x no refills with direction only to fill rx on or after 08/30/13.  Medication name:  Name from pharmacy:  ALPRAZolam (XANAX) 0.5 MG tablet  ALPRAZOLAM 0.5 MG TABLET 0.5 MG TAB Sig: TAKE 1 TABLET BY MOUTH 3 TIMES DAILY AS NEEDED Dispense: 90 tablet Refills: 0 Start: 08/27/2013 Class: Normal Requested on: 08/01/2013 Originally ordered on: 12/19/2012 Last refill: 08/01/2013

## 2013-08-29 ENCOUNTER — Ambulatory Visit: Payer: BC Managed Care – PPO | Admitting: Physician Assistant

## 2013-09-19 ENCOUNTER — Telehealth: Payer: Self-pay | Admitting: *Deleted

## 2013-09-19 NOTE — Telephone Encounter (Signed)
Received record release from [facility] requesting most recent office note be faxed to [phone]. Note has been faxed.

## 2013-09-24 ENCOUNTER — Ambulatory Visit (INDEPENDENT_AMBULATORY_CARE_PROVIDER_SITE_OTHER): Payer: BC Managed Care – PPO | Admitting: Family

## 2013-09-24 ENCOUNTER — Encounter: Payer: Self-pay | Admitting: Family

## 2013-09-24 VITALS — BP 140/82 | HR 103 | Temp 97.7°F | Resp 16 | Ht 65.0 in | Wt 142.1 lb

## 2013-09-24 DIAGNOSIS — I10 Essential (primary) hypertension: Secondary | ICD-10-CM

## 2013-09-24 MED ORDER — METOPROLOL SUCCINATE ER 50 MG PO TB24: 50.0000 mg | ORAL_TABLET | Freq: Every day | ORAL | Status: DC

## 2013-09-24 NOTE — Progress Notes (Signed)
Subjective:    Patient ID: Stephanie Montes, female    DOB: 1975-12-30, 39 y.o.   MRN: [id number]  HPI  Stephanie Montes is a 38 yr old female who presents today for follow up of hypertension. Last visit BP was above goal and toprol xl was increased from 25mg  to 50mg . She is also maintained on amlodipine 10 and hctz 25.  BP Readings from Last 3 Encounters:  09/24/13 140/82  08/20/13 142/72  07/11/13 150/88    Review of Systems See HPI  Past Medical History  Diagnosis Date  . Ovarian cyst   . Hypertension   . Anxiety     History   Social History  . Marital Status: Married    Spouse Name: N/A    Number of Children: N/A  . Years of Education: N/A   Occupational History  . Not on file.   Social History Main Topics  . Smoking status: Former Smoker    Quit date: 03/10/2013  . Smokeless tobacco: Never Used     Comment: 8-9 cigareetes daily  . Alcohol Use: No  . Drug Use: No  . Sexual Activity: Yes    Birth Control/ Protection: Condom   Other Topics Concern  . Not on file   Social History Narrative   Married, 10 years   Has a step son who is 66- she has raised him   Not currently working- wants to find a job   She is working on an Biomedical scientist at [employer]. Husband is Surveyor, mining at [employer]   Has a sister who lives in [place]. Does not have a great relationship with her mom.   Enjoys reading/puzzles.      Past Surgical History  Procedure Laterality Date  . Laparoscopy    . Dental surgery  07/2012    tooth implant    Family History  Problem Relation Age of Onset  . Anesthesia problems Neg Hx   . Cancer Mother 51    breast  . Osteoporosis Mother   . Thyroid disease Mother     hypothyroidism / grave's disease  . Hyperlipidemia Mother   . Cancer Sister 46    breast  . Stroke Father   . Hypertension Father   . Aneurysm Father     brain  . Hyperlipidemia Father   . Sudden death Father   . Cancer Paternal Aunt     breast  . Cancer Maternal  Grandfather     ?bladder  . Cancer Cousin 42    breast  . Cancer Maternal Aunt     ?brain tumor  . Cancer Paternal Aunt     ovarian  . Cancer Cousin     brain tumors  . Alzheimer's disease Paternal Uncle   . Thyroid disease Maternal Grandmother     grave's disease    No Known Allergies  Current Outpatient Prescriptions on File Prior to Visit  Medication Sig Dispense Refill  . ALPRAZolam (XANAX) 0.5 MG tablet TAKE 1 TABLET BY MOUTH 3 TIMES DAILY AS NEEDED  90 tablet  0  . amLODipine (NORVASC) 10 MG tablet Take 1 tablet (10 mg total) by mouth daily.  30 tablet  1  . Cholecalciferol (VITAMIN D PO) Take 1 tablet by mouth daily.      . cyanocobalamin 100 MCG tablet Take 100 mcg by mouth 2 (two) times daily.      Marland Kitchen escitalopram (LEXAPRO) 20 MG tablet Take 1 tablet (20 mg total) by mouth daily.  [place]  tablet  1  . hydrochlorothiazide (HYDRODIURIL) 25 MG tablet Take 1 tablet (25 mg total) by mouth daily.  30 tablet  2  . HYDROcodone-acetaminophen (NORCO) 10-325 MG per tablet Take 1 tablet by mouth as needed.      . metoprolol succinate (TOPROL-XL) 50 MG 24 hr tablet Take 1 tablet (50 mg total) by mouth daily. Take with or immediately following a meal.  30 tablet  1  . Multiple Vitamin (MULTIVITAMIN) capsule Take 1 capsule by mouth daily.      Marland Kitchen venlafaxine (EFFEXOR) 37.5 MG tablet TAKE 1 TABLET BY MOUTH EVERY DAY FOR 4 DAYS, THEN INCREASE TO 2 TABLETS DAILY THEREAFTER  60 tablet  2   No current facility-administered medications on file prior to visit.    BP 140/82  Pulse 103  Temp(Src) 97.7 F (36.5 C) (Oral)  Resp 16  Ht 5\' 5"  (1.651 m)  Wt 142 lb 1.9 oz (64.465 kg)  BMI 23.65 kg/m2  SpO2 95%       Objective:   Physical Exam  Constitutional: She is oriented to person, place, and time. She appears well-developed and well-nourished. No distress.  Cardiovascular: Normal rate.   Pulmonary/Chest: Effort normal and breath sounds normal. No respiratory distress. She has no wheezes.  She has no rales. She exhibits no tenderness.  Neurological: She is alert and oriented to person, place, and time.  Psychiatric: She has a normal mood and affect. Her behavior is normal. Judgment and thought content normal.          Assessment & Plan:

## 2013-09-24 NOTE — Assessment & Plan Note (Signed)
Fair BP control. Notes very stressful day today. She will purchase a BP monitor and send me daily BP readings in 1 week. Continue current meds.

## 2013-09-24 NOTE — Patient Instructions (Signed)
Check blood pressure once daily for 1 week and contact me with your results. Follow up in 3 months.

## 2013-09-24 NOTE — Progress Notes (Signed)
Pre visit review using our clinic review tool, if applicable. No additional management support is needed unless otherwise documented below in the visit note. 

## 2013-09-27 ENCOUNTER — Other Ambulatory Visit: Payer: Self-pay | Admitting: Family

## 2013-09-27 NOTE — Telephone Encounter (Signed)
Ok to send 90 tabs, zero refills. 

## 2013-09-27 NOTE — Telephone Encounter (Signed)
Patient requesting rf of xanax.  Last Rx written 08/27/13 but it has a note to fill on or after 08/30/13.   Last OV was 09/24/13.  Next OV is 12/24/13.  Please advise refill.

## 2013-09-28 NOTE — Telephone Encounter (Signed)
Rx called to [facility] at [facility].

## 2013-10-03 ENCOUNTER — Telehealth: Payer: Self-pay | Admitting: *Deleted

## 2013-10-03 NOTE — Telephone Encounter (Signed)
Pt called stating she has been getting erratic BP readings on her home BP monitor.  177/99 and 5 minutes later 118/76. Advised pt to check battery and operation of home monitor. If she continues to get irregular / abnormal readings she will call and schedule appt and will bring her monitor with her.

## 2013-10-15 ENCOUNTER — Ambulatory Visit: Payer: BC Managed Care – PPO | Admitting: Family

## 2013-10-17 ENCOUNTER — Ambulatory Visit: Payer: BC Managed Care – PPO | Admitting: Family

## 2013-10-17 DIAGNOSIS — Z0289 Encounter for other administrative examinations: Secondary | ICD-10-CM

## 2013-10-24 ENCOUNTER — Ambulatory Visit: Payer: Self-pay | Admitting: Family

## 2013-10-26 ENCOUNTER — Ambulatory Visit (INDEPENDENT_AMBULATORY_CARE_PROVIDER_SITE_OTHER): Payer: BC Managed Care – PPO | Admitting: Family

## 2013-10-26 ENCOUNTER — Encounter: Payer: Self-pay | Admitting: Family

## 2013-10-26 VITALS — BP 150/92 | HR 104 | Temp 98.2°F | Resp 16 | Ht 65.0 in | Wt 139.0 lb

## 2013-10-26 DIAGNOSIS — I10 Essential (primary) hypertension: Secondary | ICD-10-CM

## 2013-10-26 MED ORDER — ALPRAZOLAM 0.5 MG PO TABS: ORAL_TABLET | ORAL | Status: DC

## 2013-10-26 MED ORDER — METOPROLOL SUCCINATE ER 100 MG PO TB24: 100.0000 mg | ORAL_TABLET | Freq: Every day | ORAL | Status: DC

## 2013-10-26 NOTE — Patient Instructions (Signed)
Increase toprol xl to 100mg  once daily. Follow up in 1 month.

## 2013-10-26 NOTE — Progress Notes (Signed)
Subjective:    Patient ID: Stephanie Montes, female    DOB: March 16, 1976, 38 y.o.   MRN: [id number]  HPI   Stephanie Montes presents today for FU for HTN. She is currently maintained on toprol xl 50mg , amlodipine 10, and hctz 25.   Blood pressure range / average : She checks at home and it runs systolic 195-093 and diastolic 26-712. She feels that these readings may be in error. She bought her machine a month ago. Compliant with anti hypertemsive medication. No lightheadedness or other adverse medication effect described.  Low salt diet is followed. Exercise encompasses walking 10-20 minutes 7 times per week without symptoms.  Family history is postive for HTN-father.   BP Readings from Last 3 Encounters:  10/26/13 150/92  09/24/13 140/82  08/20/13 142/72   Review of Systems  Constitutional: Negative for fever, chills, diaphoresis, activity change and appetite change.  Respiratory: Negative for shortness of breath.   Cardiovascular: Negative for chest pain, palpitations and leg swelling.    Significant headaches, epistaxis, chest pain, palpitations, exertional dyspnea, claudication, paroxysmal nocturnal dyspnea, or edema absent.    Past Medical History  Diagnosis Date  . Ovarian cyst   . Hypertension   . Anxiety     History   Social History  . Marital Status: Married    Spouse Name: N/A    Number of Children: N/A  . Years of Education: N/A   Occupational History  . Not on file.   Social History Main Topics  . Smoking status: Former Smoker    Quit date: 03/10/2013  . Smokeless tobacco: Never Used     Comment: 8-9 cigareetes daily  . Alcohol Use: No  . Drug Use: No  . Sexual Activity: Yes    Birth Control/ Protection: Condom   Other Topics Concern  . Not on file   Social History Narrative   Married, 10 years   Has a step son who is 40- she has raised him   Not currently working- wants to find a job   She is working on an Biomedical scientist at [employer]. Husband  is Surveyor, mining at [employer]   Has a sister who lives in [place]. Does not have a great relationship with her mom.   Enjoys reading/puzzles.      Past Surgical History  Procedure Laterality Date  . Laparoscopy    . Dental surgery  07/2012    tooth implant    Family History  Problem Relation Age of Onset  . Anesthesia problems Neg Hx   . Cancer Mother 58    breast  . Osteoporosis Mother   . Thyroid disease Mother     hypothyroidism / grave's disease  . Hyperlipidemia Mother   . Cancer Sister 12    breast  . Stroke Father   . Hypertension Father   . Aneurysm Father     brain  . Hyperlipidemia Father   . Sudden death Father   . Cancer Paternal Aunt     breast  . Cancer Maternal Grandfather     ?bladder  . Cancer Cousin 42    breast  . Cancer Maternal Aunt     ?brain tumor  . Cancer Paternal Aunt     ovarian  . Cancer Cousin     brain tumors  . Alzheimer's disease Paternal Uncle   . Thyroid disease Maternal Grandmother     grave's disease    No Known Allergies  Current Outpatient Prescriptions on File Prior to  Visit  Medication Sig Dispense Refill  . amLODipine (NORVASC) 10 MG tablet Take 1 tablet (10 mg total) by mouth daily.  30 tablet  1  . Cholecalciferol (VITAMIN D PO) Take 1 tablet by mouth daily.      . cyanocobalamin 100 MCG tablet Take 100 mcg by mouth 2 (two) times daily.      Marland Kitchen escitalopram (LEXAPRO) 20 MG tablet Take 1 tablet (20 mg total) by mouth daily.  30 tablet  1  . hydrochlorothiazide (HYDRODIURIL) 25 MG tablet Take 1 tablet (25 mg total) by mouth daily.  30 tablet  2  . HYDROcodone-acetaminophen (NORCO) 10-325 MG per tablet Take 1 tablet by mouth as needed.      . Multiple Vitamin (MULTIVITAMIN) capsule Take 1 capsule by mouth daily.      Marland Kitchen venlafaxine (EFFEXOR) 37.5 MG tablet TAKE 1 TABLET BY MOUTH EVERY DAY FOR 4 DAYS, THEN INCREASE TO 2 TABLETS DAILY THEREAFTER  60 tablet  2   No current facility-administered medications on file prior to visit.      BP 150/92  Pulse 104  Temp(Src) 98.2 F (36.8 C) (Oral)  Resp 16  Ht 5\' 5"  (1.651 m)  Wt 139 lb (63.05 kg)  BMI 23.13 kg/m2  SpO2 97%    Objective:   Physical Exam  Constitutional: She appears well-developed and well-nourished. No distress.  HENT:  Head: Normocephalic and atraumatic.  Neck: No JVD present.  Cardiovascular: Normal rate, regular rhythm, normal heart sounds and intact distal pulses.   No murmur heard. Pulmonary/Chest: Effort normal and breath sounds normal. No respiratory distress. She has no wheezes. She has no rales.  Skin: Skin is warm and dry. She is not diaphoretic.  Psychiatric: She has a normal mood and affect. Her behavior is normal. Judgment and thought content normal.          Assessment & Plan:  Patient seen by [facility] NP-student.  I have personally seen and examined patient and agree with Ms. Whitmire's assessment and plan.

## 2013-10-26 NOTE — Progress Notes (Signed)
Pre visit review using our clinic review tool, if applicable. No additional management support is needed unless otherwise documented below in the visit note. 

## 2013-10-29 NOTE — Assessment & Plan Note (Signed)
Uncontrolled. Increase Toprol XL to 100 mg. FU in one month.

## 2013-11-07 ENCOUNTER — Ambulatory Visit: Payer: BC Managed Care – PPO | Admitting: Physician Assistant

## 2013-11-09 ENCOUNTER — Ambulatory Visit (INDEPENDENT_AMBULATORY_CARE_PROVIDER_SITE_OTHER): Payer: BC Managed Care – PPO | Admitting: Physician Assistant

## 2013-11-09 ENCOUNTER — Encounter: Payer: Self-pay | Admitting: Physician Assistant

## 2013-11-09 VITALS — BP 154/96 | HR 100 | Temp 98.0°F | Resp 16 | Ht 65.0 in | Wt 133.8 lb

## 2013-11-09 DIAGNOSIS — A084 Viral intestinal infection, unspecified: Secondary | ICD-10-CM

## 2013-11-09 DIAGNOSIS — A088 Other specified intestinal infections: Secondary | ICD-10-CM

## 2013-11-09 MED ORDER — ONDANSETRON HCL 4 MG PO TABS: 4.0000 mg | ORAL_TABLET | Freq: Three times a day (TID) | ORAL | Status: DC | PRN

## 2013-11-09 NOTE — Assessment & Plan Note (Signed)
Increase fluids.  Rest. Rx zofran for nausea.  Immodium if needed.  Reintroduce foods slowly.  BRAT diet given.  Patient educated on typical course of viral gastroenteritis and when to expect resolution of symptoms.  Patient advised to proceed to the ER if she develops intractable vomiting or cannot tolerate PO fluids over the weekend.

## 2013-11-09 NOTE — Progress Notes (Signed)
Patient presents to clinic today c/o 2 days of nausea, vomiting and diarrhea.  Patient states she woke up early Wednesday morning with nausea and loose stools.  Denies fever, chills, aches, hematochezia, melena or tenesmus.  States later that day she began having several episodes of non-bloody emesis. Patient states lose stools and vomiting have resolved since last night but the nausea has persisted.  Patient denies new symptoms.  Denies recent travel, sick contact or abnormal food/water source.  Past Medical History  Diagnosis Date  . Ovarian cyst   . Hypertension   . Anxiety     Current Outpatient Prescriptions on File Prior to Visit  Medication Sig Dispense Refill  . ALPRAZolam (XANAX) 0.5 MG tablet TAKE 1 TABLET BY MOUTH THREE TIMES A DAY AS NEEDED  90 tablet  0  . amLODipine (NORVASC) 10 MG tablet Take 1 tablet (10 mg total) by mouth daily.  30 tablet  1  . Cholecalciferol (VITAMIN D PO) Take 1 tablet by mouth daily.      . cyanocobalamin 100 MCG tablet Take 100 mcg by mouth 2 (two) times daily.      Marland Kitchen escitalopram (LEXAPRO) 20 MG tablet Take 1 tablet (20 mg total) by mouth daily.  30 tablet  1  . hydrochlorothiazide (HYDRODIURIL) 25 MG tablet Take 1 tablet (25 mg total) by mouth daily.  30 tablet  2  . HYDROcodone-acetaminophen (NORCO) 10-325 MG per tablet Take 1 tablet by mouth as needed.      . metoprolol succinate (TOPROL XL) 100 MG 24 hr tablet Take 1 tablet (100 mg total) by mouth daily. Take with or immediately following a meal.  30 tablet  0  . Multiple Vitamin (MULTIVITAMIN) capsule Take 1 capsule by mouth daily.      Marland Kitchen venlafaxine (EFFEXOR) 37.5 MG tablet TAKE 1 TABLET BY MOUTH EVERY DAY FOR 4 DAYS, THEN INCREASE TO 2 TABLETS DAILY THEREAFTER  60 tablet  2   No current facility-administered medications on file prior to visit.    No Known Allergies  Family History  Problem Relation Age of Onset  . Anesthesia problems Neg Hx   . Cancer Mother 24    breast  . Osteoporosis  Mother   . Thyroid disease Mother     hypothyroidism / grave's disease  . Hyperlipidemia Mother   . Cancer Sister 73    breast  . Stroke Father   . Hypertension Father   . Aneurysm Father     brain  . Hyperlipidemia Father   . Sudden death Father   . Cancer Paternal Aunt     breast  . Cancer Maternal Grandfather     ?bladder  . Cancer Cousin 42    breast  . Cancer Maternal Aunt     ?brain tumor  . Cancer Paternal Aunt     ovarian  . Cancer Cousin     brain tumors  . Alzheimer's disease Paternal Uncle   . Thyroid disease Maternal Grandmother     grave's disease    History   Social History  . Marital Status: Married    Spouse Name: N/A    Number of Children: N/A  . Years of Education: N/A   Social History Main Topics  . Smoking status: Former Smoker    Quit date: 03/10/2013  . Smokeless tobacco: Never Used     Comment: 8-9 cigareetes daily  . Alcohol Use: No  . Drug Use: No  . Sexual Activity: Yes    Birth  Control/ Protection: Condom   Other Topics Concern  . None   Social History Narrative   Married, 10 years   Has a step son who is 29- she has raised him   Not currently working- wants to find a job   She is working on an Biomedical scientist at [employer]. Husband is Surveyor, mining at [employer]   Has a sister who lives in [place]. Does not have a great relationship with her mom.   Enjoys reading/puzzles.     Review of Systems - See HPI.  All other ROS are negative.  BP 154/96  Pulse 100  Temp(Src) 98 F (36.7 C) (Oral)  Resp 16  Ht 5\' 5"  (1.651 m)  Wt 133 lb 12 oz (60.669 kg)  BMI 22.26 kg/m2  SpO2 97%  LMP 10/19/2013  Physical Exam  Vitals reviewed. Constitutional: She is oriented to person, place, and time and well-developed, well-nourished, and in no distress.  HENT:  Head: Normocephalic and atraumatic.  Right Ear: External ear normal.  Left Ear: External ear normal.  Nose: Nose normal.  Mouth/Throat: Oropharynx is clear and moist. No  oropharyngeal exudate.  TM within normal limits bilaterally.  Eyes: Conjunctivae are normal. Pupils are equal, round, and reactive to light.  Neck: Neck supple.  Cardiovascular: Normal rate, regular rhythm, normal heart sounds and intact distal pulses.   Pulmonary/Chest: Effort normal and breath sounds normal. No respiratory distress. She has no wheezes. She has no rales. She exhibits no tenderness.  Abdominal: Soft. She exhibits no distension and no mass. There is no rebound and no guarding.  Mild diffuse tenderness without rebound or guarding.  Bowel sounds are present in all four quadrants and are mildly hyperactive.  Lymphadenopathy:    She has no cervical adenopathy.  Neurological: She is alert and oriented to person, place, and time.  Skin: Skin is warm and dry. No rash noted.    Assessment/Plan: Viral gastroenteritis Increase fluids.  Rest. Rx zofran for nausea.  Immodium if needed.  Reintroduce foods slowly.  BRAT diet given.  Patient educated on typical course of viral gastroenteritis and when to expect resolution of symptoms.  Patient advised to proceed to the ER if she develops intractable vomiting or cannot tolerate PO fluids over the weekend.

## 2013-11-09 NOTE — Progress Notes (Signed)
Pre visit review using our clinic review tool, if applicable. No additional management support is needed unless otherwise documented below in the visit note/SLS  

## 2013-11-09 NOTE — Patient Instructions (Signed)
Please try to stay well hydrated.  Try to add some G2 Gatorade into your regimen, to help replenish fluid and electrolytes.  Use Zofran for nausea.  Ginger ale or ginger supplements will also help.  Restart solid foods when you feel up to it -- Banans, Rice, Applesauce or Toast are good starting options because they are easy for your body to digest.  Please read information below on the diarrhea diet.  If at any point you get severe vomiting and are unable to keep down fluids, please proceed to the ER as you may become very dehydrated.  It will take a few days for your symptoms to resolve.  I hope you feel better!  Diet for Diarrhea, Adult Frequent, runny stools (diarrhea) may be caused or worsened by food or drink. Diarrhea may be relieved by changing your diet. Since diarrhea can last up to 7 days, it is easy for you to lose too much fluid from the body and become dehydrated. Fluids that are lost need to be replaced. Along with a modified diet, make sure you drink enough fluids to keep your urine clear or pale yellow. DIET INSTRUCTIONS  Ensure adequate fluid intake (hydration): have 1 cup (8 oz) of fluid for each diarrhea episode. Avoid fluids that contain simple sugars or sports drinks, fruit juices, whole milk products, and sodas. Your urine should be clear or pale yellow if you are drinking enough fluids. Hydrate with an oral rehydration solution that you can purchase at pharmacies, retail stores, and online. You can prepare an oral rehydration solution at home by mixing the following ingredients together:    tsp table salt.   tsp baking soda.   tsp salt substitute containing potassium chloride.  1  tablespoons sugar.  1 L (34 oz) of water.  Certain foods and beverages may increase the speed at which food moves through the gastrointestinal (GI) tract. These foods and beverages should be avoided and include:  Caffeinated and alcoholic beverages.  High-fiber foods, such as raw fruits and  vegetables, nuts, seeds, and whole grain breads and cereals.  Foods and beverages sweetened with sugar alcohols, such as xylitol, sorbitol, and mannitol.  Some foods may be well tolerated and may help thicken stool including:  Starchy foods, such as rice, toast, pasta, low-sugar cereal, oatmeal, grits, baked potatoes, crackers, and bagels.   Bananas.   Applesauce.  Add probiotic-rich foods to help increase healthy bacteria in the GI tract, such as yogurt and fermented milk products. RECOMMENDED FOODS AND BEVERAGES Starches Choose foods with less than 2 g of fiber per serving.  Recommended:  White, [place], and pita breads, plain rolls, buns, bagels. Plain muffins, matzo. Soda, saltine, or graham crackers. Pretzels, melba toast, zwieback. Cooked cereals made with water: cornmeal, farina, cream cereals. Dry cereals: refined corn, wheat, rice. Potatoes prepared any way without skins, refined macaroni, spaghetti, noodles, refined rice.  Avoid:  Bread, rolls, or crackers made with whole wheat, multi-grains, rye, bran seeds, nuts, or coconut. Corn tortillas or taco shells. Cereals containing whole grains, multi-grains, bran, coconut, nuts, raisins. Cooked or dry oatmeal. Coarse wheat cereals, granola. Cereals advertised as "high-fiber." Potato skins. Whole grain pasta, wild or brown rice. Popcorn. Sweet potatoes, yams. Sweet rolls, doughnuts, waffles, pancakes, sweet breads. Vegetables  Recommended: Strained tomato and vegetable juices. Most well-cooked and canned vegetables without seeds. Fresh: Tender lettuce, cucumber without the skin, cabbage, spinach, bean sprouts.  Avoid: Fresh, cooked, or canned: Artichokes, baked beans, beet greens, broccoli, Brussels sprouts, corn, kale,  legumes, peas, sweet potatoes. Cooked: Green or red cabbage, spinach. Avoid large servings of any vegetables because vegetables shrink when cooked, and they contain more fiber per serving than fresh  vegetables. Fruit  Recommended: Cooked or canned: Apricots, applesauce, cantaloupe, cherries, fruit cocktail, grapefruit, grapes, kiwi, mandarin oranges, peaches, pears, plums, watermelon. Fresh: Apples without skin, ripe banana, grapes, cantaloupe, cherries, grapefruit, peaches, oranges, plums. Keep servings limited to  cup or 1 piece.  Avoid: Fresh: Apples with skin, apricots, mangoes, pears, raspberries, strawberries. Prune juice, stewed or dried prunes. Dried fruits, raisins, dates. Large servings of all fresh fruits. Protein  Recommended: Ground or well-cooked tender beef, ham, veal, lamb, pork, or poultry. Eggs. Fish, oysters, shrimp, lobster, other seafoods. Liver, organ meats.  Avoid: Tough, fibrous meats with gristle. Peanut butter, smooth or chunky. Cheese, nuts, seeds, legumes, dried peas, beans, lentils. Dairy  Recommended: Yogurt, lactose-free milk, kefir, drinkable yogurt, buttermilk, soy milk, or plain hard cheese.  Avoid: Milk, chocolate milk, beverages made with milk, such as milkshakes. Soups  Recommended: Bouillon, broth, or soups made from allowed foods. Any strained soup.  Avoid: Soups made from vegetables that are not allowed, cream or milk-based soups. Desserts and Sweets  Recommended: Sugar-free gelatin, sugar-free frozen ice pops made without sugar alcohol.  Avoid: Plain cakes and cookies, pie made with fruit, pudding, custard, cream pie. Gelatin, fruit, ice, sherbet, frozen ice pops. Ice cream, ice milk without nuts. Plain hard candy, honey, jelly, molasses, syrup, sugar, chocolate syrup, gumdrops, marshmallows. Fats and Oils  Recommended: Limit fats to less than 8 tsp per day.  Avoid: Seeds, nuts, olives, avocados. Margarine, butter, cream, mayonnaise, salad oils, plain salad dressings. Plain gravy, crisp bacon without rind. Beverages  Recommended: Water, decaffeinated teas, oral rehydration solutions, sugar-free beverages not sweetened with sugar  alcohols.  Avoid: Fruit juices, caffeinated beverages (coffee, tea, soda), alcohol, sports drinks, or lemon-lime soda. Condiments  Recommended: Ketchup, mustard, horseradish, vinegar, cocoa powder. Spices in moderation: allspice, basil, bay leaves, celery powder or leaves, cinnamon, cumin powder, curry powder, ginger, mace, marjoram, onion or garlic powder, oregano, paprika, parsley flakes, ground pepper, rosemary, sage, savory, tarragon, thyme, turmeric.  Avoid: Coconut, honey. Document Released: 08/07/2003 Document Revised: 02/09/2012 Document Reviewed: 10/01/2011 [facility] Patient Information 2014 [place].

## 2013-11-21 ENCOUNTER — Other Ambulatory Visit: Payer: Self-pay | Admitting: Family

## 2013-11-22 ENCOUNTER — Telehealth: Payer: Self-pay | Admitting: Family

## 2013-11-22 ENCOUNTER — Other Ambulatory Visit: Payer: Self-pay | Admitting: Family

## 2013-11-22 NOTE — Telephone Encounter (Signed)
Stephanie Montes wanted me to let you know the pt has cancelled multiple appts and has a NOS

## 2013-11-23 ENCOUNTER — Encounter: Payer: Self-pay | Admitting: Physician Assistant

## 2013-11-23 ENCOUNTER — Ambulatory Visit (INDEPENDENT_AMBULATORY_CARE_PROVIDER_SITE_OTHER): Payer: BC Managed Care – PPO | Admitting: Physician Assistant

## 2013-11-23 ENCOUNTER — Ambulatory Visit: Payer: BC Managed Care – PPO | Admitting: Physician Assistant

## 2013-11-23 VITALS — BP 176/94 | HR 85 | Temp 98.2°F | Resp 14 | Ht 65.0 in | Wt 141.2 lb

## 2013-11-23 DIAGNOSIS — F411 Generalized anxiety disorder: Secondary | ICD-10-CM

## 2013-11-23 DIAGNOSIS — R110 Nausea: Secondary | ICD-10-CM

## 2013-11-23 DIAGNOSIS — F419 Anxiety disorder, unspecified: Secondary | ICD-10-CM

## 2013-11-23 MED ORDER — VENLAFAXINE HCL 50 MG PO TABS: 50.0000 mg | ORAL_TABLET | Freq: Two times a day (BID) | ORAL | Status: DC

## 2013-11-23 MED ORDER — ALPRAZOLAM 0.5 MG PO TABS: ORAL_TABLET | ORAL | Status: DC

## 2013-11-23 NOTE — Telephone Encounter (Signed)
Alprazolam rx was printed at today's visit with Elyn Aquas, Pa-c.

## 2013-11-23 NOTE — Progress Notes (Signed)
Pre visit review using our clinic review tool, if applicable. No additional management support is needed unless otherwise documented below in the visit note/SLS  

## 2013-11-23 NOTE — Telephone Encounter (Signed)
Rx printed and given to pt by PA, Martin at 11/23/13 office visit.

## 2013-11-23 NOTE — Assessment & Plan Note (Signed)
Increase Venlafaxine to 50 mg BID.  Continue Lexapro.  Handout given for [employer].  Encouraged patient to schedule appointment as I feel this will be beneficial to her.  Follow-up 1 month.  Return sooner if needed.

## 2013-11-23 NOTE — Progress Notes (Signed)
Patient presents to clinic today c/o worsening stress and anxiety stemming from marital problems.  States her husband "is not interested anymore" and she does not think he will be around much longer.  Endorses panic attack.  Denies SI/HI.    Patient also endorses nausea with PM medications.  Takes all of her vitamins with her Toprol XL after dinner and has noted nausea over the past couple of weeks.  Denies emesis or change in bowel habits.  Past Medical History  Diagnosis Date  . Ovarian cyst   . Hypertension   . Anxiety     Current Outpatient Prescriptions on File Prior to Visit  Medication Sig Dispense Refill  . amLODipine (NORVASC) 10 MG tablet Take 1 tablet (10 mg total) by mouth daily.  30 tablet  1  . Cholecalciferol (VITAMIN D PO) Take 1 tablet by mouth daily.      . cyanocobalamin 100 MCG tablet Take 100 mcg by mouth 2 (two) times daily.      Marland Kitchen escitalopram (LEXAPRO) 20 MG tablet Take 1 tablet (20 mg total) by mouth daily.  30 tablet  1  . hydrochlorothiazide (HYDRODIURIL) 25 MG tablet Take 1 tablet (25 mg total) by mouth daily.  30 tablet  2  . HYDROcodone-acetaminophen (NORCO) 10-325 MG per tablet Take 1 tablet by mouth as needed.      . metoprolol succinate (TOPROL XL) 100 MG 24 hr tablet Take 1 tablet (100 mg total) by mouth daily. Take with or immediately following a meal.  30 tablet  0  . Multiple Vitamin (MULTIVITAMIN) capsule Take 1 capsule by mouth daily.       No current facility-administered medications on file prior to visit.    No Known Allergies  Family History  Problem Relation Age of Onset  . Anesthesia problems Neg Hx   . Cancer Mother 16    breast  . Osteoporosis Mother   . Thyroid disease Mother     hypothyroidism / grave's disease  . Hyperlipidemia Mother   . Cancer Sister 107    breast  . Stroke Father   . Hypertension Father   . Aneurysm Father     brain  . Hyperlipidemia Father   . Sudden death Father   . Cancer Paternal Aunt     breast  .  Cancer Maternal Grandfather     ?bladder  . Cancer Cousin 42    breast  . Cancer Maternal Aunt     ?brain tumor  . Cancer Paternal Aunt     ovarian  . Cancer Cousin     brain tumors  . Alzheimer's disease Paternal Uncle   . Thyroid disease Maternal Grandmother     grave's disease    History   Social History  . Marital Status: Married    Spouse Name: N/A    Number of Children: N/A  . Years of Education: N/A   Social History Main Topics  . Smoking status: Former Smoker    Quit date: 03/10/2013  . Smokeless tobacco: Never Used     Comment: 8-9 cigareetes daily  . Alcohol Use: No  . Drug Use: No  . Sexual Activity: Yes    Birth Control/ Protection: Condom   Other Topics Concern  . 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 Husband is Surveyor, mining at [employer]   Has a sister who lives in [place]. Does not have a great relationship with her mom.   Enjoys reading/puzzles.     Review of Systems - See HPI.  All other ROS are negative.  BP 176/94  Pulse 85  Temp(Src) 98.2 F (36.8 C) (Oral)  Resp 14  Ht 5\' 5"  (1.651 m)  Wt 141 lb 4 oz (64.071 kg)  BMI 23.51 kg/m2  SpO2 98%  LMP 11/16/2013  Physical Exam  Vitals reviewed. Constitutional: She is oriented to person, place, and time and well-developed, well-nourished, and in no distress.  HENT:  Head: Normocephalic and atraumatic.  Cardiovascular: Normal rate, regular rhythm, normal heart sounds and intact distal pulses.   Pulmonary/Chest: Effort normal and breath sounds normal. No respiratory distress. She has no wheezes. She has no rales. She exhibits no tenderness.  Abdominal: Soft. Bowel sounds are normal. She exhibits no distension. There is no tenderness.  Neurological: She is alert and oriented to person, place, and time.  Skin: Skin is warm and dry.   Psychiatric:  Anxious affect.   Assessment/Plan: Anxiety Increase Venlafaxine to 50 mg BID.  Continue Lexapro.  Handout given for [employer].  Encouraged patient to schedule appointment as I feel this will be beneficial to her.  Follow-up 1 month.  Return sooner if needed.  Nausea alone Unclear cause.  Possibly due to Increased Toprol XL dosage. Encouraged patient to try taking nighttime medications one at a time to see which is the culprit.  If Toprol is causing symptoms, we may need to reduce dose and increase dose of a different antihypertensive agent.

## 2013-11-23 NOTE — Assessment & Plan Note (Signed)
Unclear cause.  Possibly due to Increased Toprol XL dosage. Encouraged patient to try taking nighttime medications one at a time to see which is the culprit.  If Toprol is causing symptoms, we may need to reduce dose and increase dose of a different antihypertensive agent.

## 2013-11-23 NOTE — Patient Instructions (Signed)
Please take new dose of Venlafaxine -- 50 mg twice daily. Continue Lexapro and Xanax.  I would urge you to set up an appointment with a Musician.  I think it would be very beneficial.    For nausea -- try taking Toprol without the vitamins at the same time.  See if this helps.  If nausea persists, we can decrease the Toprol and increase your hydrochlorothiazide.  Just give [place] a call.

## 2013-11-26 ENCOUNTER — Telehealth: Payer: Self-pay | Admitting: *Deleted

## 2013-11-26 NOTE — Telephone Encounter (Signed)
I would recommend that she call [facility] and request to meet with Rodney Cruise or Marya Amsler [phone]).  If her symptoms are not improved at her 1 month follow up after the changes that Elyn Aquas made, we can consider psychiatry referral.

## 2013-11-26 NOTE — Telephone Encounter (Signed)
Received call from pt requesting referral to counselor or psychiatrist for her depression. Pt would like to know who you recommend?

## 2013-11-26 NOTE — Telephone Encounter (Signed)
Notified pt and she voices understanding. 

## 2013-12-07 ENCOUNTER — Ambulatory Visit: Payer: BC Managed Care – PPO | Admitting: Psychology

## 2013-12-15 ENCOUNTER — Encounter (HOSPITAL_BASED_OUTPATIENT_CLINIC_OR_DEPARTMENT_OTHER): Payer: Self-pay | Admitting: Emergency Medicine

## 2013-12-15 ENCOUNTER — Emergency Department (HOSPITAL_BASED_OUTPATIENT_CLINIC_OR_DEPARTMENT_OTHER): Payer: BC Managed Care – PPO

## 2013-12-15 ENCOUNTER — Emergency Department (HOSPITAL_BASED_OUTPATIENT_CLINIC_OR_DEPARTMENT_OTHER)
Admission: EM | Admit: 2013-12-15 | Discharge: 2013-12-16 | Disposition: A | Discharge status: Home / Self Care | Payer: BC Managed Care – PPO | Attending: Emergency Medicine | Admitting: Emergency Medicine

## 2013-12-15 DIAGNOSIS — F411 Generalized anxiety disorder: Secondary | ICD-10-CM | POA: Insufficient documentation

## 2013-12-15 DIAGNOSIS — Z79899 Other long term (current) drug therapy: Secondary | ICD-10-CM | POA: Insufficient documentation

## 2013-12-15 DIAGNOSIS — Y9389 Activity, other specified: Secondary | ICD-10-CM | POA: Insufficient documentation

## 2013-12-15 DIAGNOSIS — Z87891 Personal history of nicotine dependence: Secondary | ICD-10-CM | POA: Insufficient documentation

## 2013-12-15 DIAGNOSIS — I10 Essential (primary) hypertension: Secondary | ICD-10-CM | POA: Insufficient documentation

## 2013-12-15 DIAGNOSIS — Z8742 Personal history of other diseases of the female genital tract: Secondary | ICD-10-CM | POA: Insufficient documentation

## 2013-12-15 DIAGNOSIS — S61219A Laceration without foreign body of unspecified finger without damage to nail, initial encounter: Secondary | ICD-10-CM

## 2013-12-15 DIAGNOSIS — Y9289 Other specified places as the place of occurrence of the external cause: Secondary | ICD-10-CM | POA: Insufficient documentation

## 2013-12-15 DIAGNOSIS — S61209A Unspecified open wound of unspecified finger without damage to nail, initial encounter: Secondary | ICD-10-CM | POA: Insufficient documentation

## 2013-12-15 DIAGNOSIS — W1809XA Striking against other object with subsequent fall, initial encounter: Secondary | ICD-10-CM | POA: Insufficient documentation

## 2013-12-15 MED ORDER — IBUPROFEN 800 MG PO TABS: 800.0000 mg | ORAL_TABLET | Freq: Three times a day (TID) | ORAL | Status: DC

## 2013-12-15 MED ORDER — OXYCODONE-ACETAMINOPHEN 5-325 MG PO TABS
1.0000 | ORAL_TABLET | Freq: Once | ORAL | Status: AC
  Administered 2013-12-15: 1 via ORAL
  Filled 2013-12-15: qty 1

## 2013-12-15 MED ORDER — TETANUS-DIPHTH-ACELL PERTUSSIS 5-2.5-18.5 LF-MCG/0.5 IM SUSP
0.5000 mL | Freq: Once | INTRAMUSCULAR | Status: AC
  Administered 2013-12-15: 0.5 mL via INTRAMUSCULAR
  Filled 2013-12-15: qty 0.5

## 2013-12-15 MED ORDER — HYDROCODONE-ACETAMINOPHEN 5-325 MG PO TABS: 1.0000 | ORAL_TABLET | ORAL | Status: DC | PRN

## 2013-12-15 NOTE — ED Provider Notes (Addendum)
CSN: [id number]     Arrival date & time 12/15/13  2142 History   First MD Initiated Contact with Patient 12/15/13 2211     Chief Complaint  Patient presents with  . Laceration     (Consider location/radiation/quality/duration/timing/severity/associated sxs/prior Treatment) Patient is a 38 y.o. female presenting with skin laceration and hand pain. The history is provided by the patient. No language interpreter was used.  Laceration Location:  Finger Finger laceration location:  R ring finger Length (cm):  1 Hand Pain This is a new problem. Pertinent negatives include no fever or numbness. Associated symptoms comments: She fell onto flexed right hand, causing hyperextension injury to 4th finger with laceration. No wrist pain. No other injury..    Past Medical History  Diagnosis Date  . Ovarian cyst   . Hypertension   . Anxiety    Past Surgical History  Procedure Laterality Date  . Laparoscopy    . Dental surgery  07/2012    tooth implant   Family History  Problem Relation Age of Onset  . Anesthesia problems Neg Hx   . Cancer Mother 63    breast  . Osteoporosis Mother   . Thyroid disease Mother     hypothyroidism / grave's disease  . Hyperlipidemia Mother   . Cancer Sister 25    breast  . Stroke Father   . Hypertension Father   . Aneurysm Father     brain  . Hyperlipidemia Father   . Sudden death Father   . Cancer Paternal Aunt     breast  . Cancer Maternal Grandfather     ?bladder  . Cancer Cousin 42    breast  . Cancer Maternal Aunt     ?brain tumor  . Cancer Paternal Aunt     ovarian  . Cancer Cousin     brain tumors  . Alzheimer's disease Paternal Uncle   . Thyroid disease Maternal Grandmother     grave's disease   History  Substance Use Topics  . Smoking status: Former Smoker    Quit date: 03/10/2013  . Smokeless tobacco: Never Used     Comment: 8-9 cigareetes daily  . Alcohol Use: No   OB History   Grav Para Term Preterm Abortions TAB SAB Ect  Mult Living   1 0 0 0 1 0 1 0 0 0      Review of Systems  Constitutional: Negative for fever.  Musculoskeletal:       Hand injury per HPI.  Skin: Positive for wound.  Neurological: Negative for numbness.      Allergies  Review of patient's allergies indicates no known allergies.  Home Medications   Prior to Admission medications   Medication Sig Start Date End Date Taking? Authorizing Provider  ALPRAZolam Duanne Moron) 0.5 MG tablet TAKE 1 TABLET BY MOUTH THREE TIMES A DAY AS NEEDED 11/23/13   Leeanne Rio, PA-C  amLODipine (NORVASC) 10 MG tablet Take 1 tablet (10 mg total) by mouth daily. 06/05/13   Debbrah Alar, NP  Cholecalciferol (VITAMIN D PO) Take 1 tablet by mouth daily.    Historical Provider, MD  cyanocobalamin 100 MCG tablet Take 100 mcg by mouth 2 (two) times daily.    Historical Provider, MD  escitalopram (LEXAPRO) 20 MG tablet Take 1 tablet (20 mg total) by mouth daily. 06/05/13   Debbrah Alar, NP  hydrochlorothiazide (HYDRODIURIL) 25 MG tablet Take 1 tablet (25 mg total) by mouth daily. 05/01/13   Debbrah Alar, NP  HYDROcodone-acetaminophen [facility])  10-325 MG per tablet Take 1 tablet by mouth as needed. 05/28/13   Historical Provider, MD  metoprolol succinate (TOPROL XL) 100 MG 24 hr tablet Take 1 tablet (100 mg total) by mouth daily. Take with or immediately following a meal. 10/26/13   Debbrah Alar, NP  Multiple Vitamin (MULTIVITAMIN) capsule Take 1 capsule by mouth daily.    Historical Provider, MD  venlafaxine (EFFEXOR) 50 MG tablet Take 1 tablet (50 mg total) by mouth 2 (two) times daily. 11/23/13   Leeanne Rio, PA-C   BP 158/89  Pulse 101  Temp(Src) 97.9 F (36.6 C)  Resp 18  Wt 147 lb (66.679 kg)  SpO2 96%  LMP 11/16/2013 Physical Exam  Constitutional: She is oriented to person, place, and time. She appears well-developed and well-nourished. No distress.  Musculoskeletal: Normal range of motion.  Right 4th finger mildly swollen,  held in flexion. 1 cm laceration over proximal phalynx. She has full flexion and extension on passive ROM and against resistance to all joints of finger.   Neurological: She is oriented to person, place, and time.  Skin: Skin is warm and dry.  Psychiatric: She has a normal mood and affect.    ED Course  Procedures (including critical care time) Labs Review Labs Reviewed - No data to display  Imaging Review Dg Finger Ring Right  12/15/2013   CLINICAL DATA:  Right fourth digit injury  EXAM: RIGHT RING FINGER 2+V  COMPARISON:  None.  FINDINGS: There is no evidence of fracture or dislocation. There is no evidence of arthropathy or other focal bone abnormality. Soft tissues are unremarkable.  IMPRESSION: Negative.   Electronically Signed   By: Kathreen Devoid   On: 12/15/2013 22:49     EKG Interpretation None     LACERATION REPAIR Performed by: Charlann Lange A Authorized by: Charlann Lange A Consent: Verbal consent obtained. Risks and benefits: risks, benefits and alternatives were discussed Consent given by: patient Patient identity confirmed: provided demographic data Prepped and Draped in normal sterile fashion Wound explored  Laceration Location: right ring finger  Laceration Length:  2 cm  No Foreign Bodies seen or palpated  Anesthesia: local infiltration  Local anesthetic: lidocaine 2% w/o epinephrine via digital block  Anesthetic total: 3 ml  Irrigation method: syringe Amount of cleaning: standard  Skin closure: 4-0 prolene  Number of sutures: 5  Technique: simple interrupted  Patient tolerance: Patient tolerated the procedure well with no immediate complications.  MDM   Final diagnoses:  None    1. Finger laceration  Full flex/ext of finger - doubt tendon injury. Laceration repaired per note. Finger splinted for comfort and hand referral made.     Dewaine Oats, PA-C 12/15/13 [place], PA-C 12/20/13 2024

## 2013-12-15 NOTE — ED Notes (Signed)
Pt reports she was attempting to sit down on a cooler when she fell backwards and injured her left 4th digit, sustained 1cm laceration, bleeding ceased at present.

## 2013-12-15 NOTE — ED Notes (Signed)
Pt reports she fell and put her hand out to break her fall and landed on a tree root that caused a laceration to her ring finger on her right hand. She also states her finger bent backwards

## 2013-12-15 NOTE — ED Provider Notes (Signed)
Medical screening examination/treatment/procedure(s) were performed by non-physician practitioner and as supervising physician I was immediately available for consultation/collaboration.   EKG Interpretation None        Shannon, DO 12/15/13 2357

## 2013-12-15 NOTE — Discharge Instructions (Signed)
Tetanus and Diphtheria Vaccine Your caregiver has suggested that you receive an immunization to prevent tetanus (lockjaw) and diphtheria. Tetanus and diphtheria are serious and deadly infectious diseases of the past that have been nearly wiped out by modern immunizations. Td or DT vaccines (shots) are the immunizations given to help prevent these illnesses. Td is the medical term for a standard tetanus dose, small diphtheria dose. DT means both in standard doses. ABOUT THE DISEASES Tetanus (lockjaw) and diphtheria are serious diseases. Tetanus is caused by a germ that lives in the soil. It enters the body through a cut or wound, often caused by a nail or broken piece of glass. You cannot catch tetanus from another person. Diphtheria spreads when germs pass from an infected person to the nose or throat of others. Tetanus causes serious, painful spasms of all muscles. It can lead to:  "Locking" of the muscles of the jaw and throat, so the patient cannot open his or her mouth or swallow.  Damage to the heart muscle. Diphtheria causes a thick coating in the nose, throat, or airway. It can lead to:  Breathing problems.  Kidney problems.  Heart failure.  Paralysis.  Death. ABOUT THE VACCINES  A vaccine is a shot (immunization) that can help prevent a disease. Vaccines have helped lower the rates of getting certain diseases. If people stopped getting vaccinated, more people would develop illnesses. These vaccines can be used in three ways:  As catch-up for people who did not get all their doses when they were children.  As a booster dose every 10 years.  For protection against tetanus infection, after a wound. Benefits of the vaccines Vaccination is the best way to protect against tetanus and diphtheria. Because of vaccination, there are fewer cases of these diseases. Cases are rare in children because most get a routine vaccination with DTP (Diphtheria, Tetanus, and Pertussis), DTaP  (Diphtheria, Tetanus, and acellular Pertussis), or DT (Diphtheria and Tetanus) vaccines. There would be many more cases if we stopped vaccinating people. Tetanus kills about 1 in 5 people who are infected. WHEN SHOULD YOU GET TD VACCINE?  Td is made for people 62 years of age and older.  People who have not gotten at least 3 doses of any tetanus and diphtheria vaccine (DTP, DTaP or DT) during their lifetime should do so using Td. After a person gets the third dose, a Td dose is needed every 10 years all through life. This is because protection fades over time. Booster shots are needed every 10 years.  Other vaccines may be given at the same time as Td. You may not know today whether your immunizations are current. The vaccine given today is to protect you from your next cut or injury. It does not offer protection for the current injury. An immune globulin injection may be given, if protection is needed immediately. Check with your caregiver later regarding your immunization status. Tell your caregiver if the person getting the vaccine:  Has ever had a serious allergic reaction or other problem with Td, or any other tetanus and diphtheria vaccine (DTP, DTaP, or DT). People who have had a serious allergic reaction should not receive the vaccine.  Has epilepsy or another nervous system illness.  Has had Guillain Barre Syndrome (GBS) in the past.  Now has a moderate or severe illness.  Is pregnant.  If you are not sure, ask your caregiver. WHAT ARE THE RISKS FROM TD VACCINE?  As with any medicine, there are very small  risks that serious problems, even death, could occur after getting a vaccine. However, the risk of a serious side effect from the vaccine is almost zero.  The risks from the vaccine are much smaller than the risks from the diseases, if people stopped getting vaccinated. Both diseases can cause serious health problems, which are prevented by the vaccine.  Almost all people who get  Td have no problems from it. Mild problems If mild problems occur, they usually start within hours to a day or two after vaccination. They may last 1-2 days:  Soreness, redness, or swelling where the shot was given.  Headache or tiredness.  Occasionally, a low grade fever. These problems can be worse in adults who get Td vaccine very often. Non-aspirin medicines may be used to reduce soreness. Severe problems These problems happen very rarely:  Serious allergic reaction (at most, occurs in 1 in 1 million vaccinated persons). This occurs almost immediately, and is treatable with medicines. Signs of a serious allergic reaction include:  Difficulty breathing.  Hoarseness or wheezing.  Hives.  Dizziness.  Deep, aching pain and muscle wasting in upper arm(s). Overall, the benefits to you and your family from these vaccines are far greater than the risk. WHAT TO DO IF THERE IS A SERIOUS REACTION:  Call a caregiver or get the person to a doctor or emergency room right away.  Write down what happened, the date and time it happened, and tell your caregiver.  Ask your caregiver to file a Vaccine Adverse Event Report form or call, toll-free: [phone] If you want to learn more about this vaccine, ask your caregiver. She/he can give you the vaccine package insert or suggest other sources of information. Also, the [employer] gives compensation (payment) for persons thought to be injured by vaccines. For details call, toll-free: (800) 832-154-5017. Document Released: 05/14/2000 Document Revised: 08/09/2011 Document Reviewed: 04/03/2009 [facility] Patient Information 2015 [place], [place]. This information is not intended to replace advice given to you by your health care provider. Make sure you discuss any questions you have with your health care provider. Sutured Wound Care Sutures are stitches that can be used to close wounds. Wound care helps prevent pain and  infection.  HOME CARE INSTRUCTIONS   Rest and elevate the injured area until all the pain and swelling are gone.  Only take over-the-counter or prescription medicines for pain, discomfort, or fever as directed by your caregiver.  After 48 hours, gently wash the area with mild soap and water once a day, or as directed. Rinse off the soap. Pat the area dry with a clean towel. Do not rub the wound. This may cause bleeding.  Follow your caregiver's instructions for how often to change the bandage (dressing). Stop using a dressing after 2 days or after the wound stops draining.  If the dressing sticks, moisten it with soapy water and gently remove it.  Apply ointment on the wound as directed.  Avoid stretching a sutured wound.  Drink enough fluids to keep your urine clear or pale yellow.  Follow up with your caregiver for suture removal as directed.  Use sunscreen on your wound for the next 3 to 6 months so the scar will not darken. SEEK IMMEDIATE MEDICAL CARE IF:   Your wound becomes red, swollen, hot, or tender.  You have increasing pain in the wound.  You have a red streak that extends from the wound.  There is pus coming from the wound.  You  have a fever.  You have shaking chills.  There is a bad smell coming from the wound.  You have persistent bleeding from the wound. MAKE SURE YOU:   Understand these instructions.  Will watch your condition.  Will get help right away if you are not doing well or get worse. Document Released: 06/24/2004 Document Revised: 08/09/2011 Document Reviewed: 09/20/2010 [facility] Patient Information 2015 [place], [place]. This information is not intended to replace advice given to you by your health care provider. Make sure you discuss any questions you have with your health care provider.

## 2013-12-16 NOTE — ED Notes (Signed)
Pt ambulating independently w/ steady gait on d/c in no acute distress, A&Ox4. D/c instructions reviewed w/ pt and family - pt and family deny any further questions or concerns at present. Rx given x2  

## 2013-12-17 ENCOUNTER — Ambulatory Visit (INDEPENDENT_AMBULATORY_CARE_PROVIDER_SITE_OTHER): Payer: BC Managed Care – PPO | Admitting: Family

## 2013-12-17 ENCOUNTER — Encounter: Payer: Self-pay | Admitting: Family

## 2013-12-17 VITALS — BP 160/78 | HR 94 | Temp 98.0°F | Resp 16 | Ht 65.0 in | Wt 135.1 lb

## 2013-12-17 DIAGNOSIS — S61209A Unspecified open wound of unspecified finger without damage to nail, initial encounter: Secondary | ICD-10-CM

## 2013-12-17 DIAGNOSIS — IMO0001 Reserved for inherently not codable concepts without codable children: Secondary | ICD-10-CM | POA: Insufficient documentation

## 2013-12-17 DIAGNOSIS — S61219A Laceration without foreign body of unspecified finger without damage to nail, initial encounter: Secondary | ICD-10-CM

## 2013-12-17 MED ORDER — CEPHALEXIN 500 MG PO CAPS: 500.0000 mg | ORAL_CAPSULE | Freq: Two times a day (BID) | ORAL | Status: DC

## 2013-12-17 NOTE — Progress Notes (Signed)
Pre visit review using our clinic review tool, if applicable. No additional management support is needed unless otherwise documented below in the visit note. 

## 2013-12-17 NOTE — Progress Notes (Signed)
Subjective:    Patient ID: Stephanie Montes, female    DOB: November 04, 1975, 38 y.o.   MRN: [id number]  HPI  Stephanie Montes is a 38 yr old female who presents today for follow up of the laceration in the right ring finger.  She sustained injury to this finger 2 days ago when she slipped off a cooler and her hand struck a tree root.  She was seen in the ED and stitches were placed. She notes increase pain in swelling in the finger and the hand. She continues percocet per pain management. Was given rx for hydrocodone by the ED but has not filled  Notes pain persists despite the percocet and ibuprofen.  Review of Systems See HPI  Past Medical History  Diagnosis Date  . Ovarian cyst   . Hypertension   . Anxiety     History   Social History  . Marital Status: Married    Spouse Name: N/A    Number of Children: N/A  . Years of Education: N/A   Occupational History  . Not on file.   Social History Main Topics  . Smoking status: Former Smoker    Quit date: 03/10/2013  . Smokeless tobacco: Never Used     Comment: 8-9 cigareetes daily  . Alcohol Use: No  . Drug Use: No  . Sexual Activity: Yes    Birth Control/ Protection: Condom   Other Topics Concern  . Not on file   Social History Narrative   Married, 10 years   Has a step son who is 13- she has raised him   Not currently working- wants to find a job   She is working on an Biomedical scientist at [employer]. Husband is Surveyor, mining at [employer]   Has a sister who lives in [place]. Does not have a great relationship with her mom.   Enjoys reading/puzzles.      Past Surgical History  Procedure Laterality Date  . Laparoscopy    . Dental surgery  07/2012    tooth implant    Family History  Problem Relation Age of Onset  . Anesthesia problems Neg Hx   . Cancer Mother 45    breast  . Osteoporosis Mother   . Thyroid disease Mother     hypothyroidism / grave's disease  . Hyperlipidemia Mother   . Cancer Sister 86    breast  .  Stroke Father   . Hypertension Father   . Aneurysm Father     brain  . Hyperlipidemia Father   . Sudden death Father   . Cancer Paternal Aunt     breast  . Cancer Maternal Grandfather     ?bladder  . Cancer Cousin 42    breast  . Cancer Maternal Aunt     ?brain tumor  . Cancer Paternal Aunt     ovarian  . Cancer Cousin     brain tumors  . Alzheimer's disease Paternal Uncle   . Thyroid disease Maternal Grandmother     grave's disease    No Known Allergies  Current Outpatient Prescriptions on File Prior to Visit  Medication Sig Dispense Refill  . ALPRAZolam (XANAX) 0.5 MG tablet TAKE 1 TABLET BY MOUTH THREE TIMES A DAY AS NEEDED  90 tablet  0  . amLODipine (NORVASC) 10 MG tablet Take 1 tablet (10 mg total) by mouth daily.  30 tablet  1  . Cholecalciferol (VITAMIN D PO) Take 1 tablet by mouth daily.      Marland Kitchen  cyanocobalamin 100 MCG tablet Take 100 mcg by mouth 2 (two) times daily.      Marland Kitchen escitalopram (LEXAPRO) 20 MG tablet Take 1 tablet (20 mg total) by mouth daily.  30 tablet  1  . hydrochlorothiazide (HYDRODIURIL) 25 MG tablet Take 1 tablet (25 mg total) by mouth daily.  30 tablet  2  . HYDROcodone-acetaminophen (NORCO) 10-325 MG per tablet Take 1 tablet by mouth as needed.      Marland Kitchen ibuprofen (ADVIL,MOTRIN) 800 MG tablet Take 1 tablet (800 mg total) by mouth 3 (three) times daily.  21 tablet  0  . metoprolol succinate (TOPROL XL) 100 MG 24 hr tablet Take 1 tablet (100 mg total) by mouth daily. Take with or immediately following a meal.  30 tablet  0  . Multiple Vitamin (MULTIVITAMIN) capsule Take 1 capsule by mouth daily.      Marland Kitchen venlafaxine (EFFEXOR) 50 MG tablet Take 1 tablet (50 mg total) by mouth 2 (two) times daily.  60 tablet  1   No current facility-administered medications on file prior to visit.    BP 160/78  Pulse 94  Temp(Src) 98 F (36.7 C) (Oral)  Resp 16  Ht 5\' 5"  (1.651 m)  Wt 135 lb 1.3 oz (61.272 kg)  BMI 22.48 kg/m2  SpO2 99%  LMP 11/16/2013         Objective:   Physical Exam  Constitutional: She appears well-developed.  Cardiovascular:  Good cap refill of fingers right hand  Musculoskeletal:  Right ring finger is swollen. Some swelling of the right hand is also noted.  No erythema. Stitches are clear dry and intac.   Psychiatric:  Anxious appearing          Assessment & Plan:

## 2013-12-17 NOTE — Patient Instructions (Signed)
Start Keflex to help prevent infection in your hand. Keep right hand elevated over your heart as much as possible. Call if redness/increased swelling occurs. Follow up in 1 week for suture removal.

## 2013-12-17 NOTE — Assessment & Plan Note (Signed)
Will add empiric keflex given increased pain and swelling. Advised pt to keep the hand elevated above her heart. Follow up in 1 week for suture removal.   Continue percocet and ibuprofen, hydrocodone unlikely to help. Pt reports that she has been taking more xanax to help the pain and I advised her against this.

## 2013-12-21 ENCOUNTER — Telehealth: Payer: Self-pay | Admitting: Family

## 2013-12-21 ENCOUNTER — Ambulatory Visit (INDEPENDENT_AMBULATORY_CARE_PROVIDER_SITE_OTHER): Payer: BC Managed Care – PPO | Admitting: Family

## 2013-12-21 ENCOUNTER — Encounter: Payer: Self-pay | Admitting: Family

## 2013-12-21 VITALS — BP 160/90 | HR 78 | Temp 98.1°F | Resp 16 | Ht 65.0 in | Wt 142.0 lb

## 2013-12-21 DIAGNOSIS — I10 Essential (primary) hypertension: Secondary | ICD-10-CM

## 2013-12-21 DIAGNOSIS — F419 Anxiety disorder, unspecified: Secondary | ICD-10-CM

## 2013-12-21 DIAGNOSIS — F411 Generalized anxiety disorder: Secondary | ICD-10-CM

## 2013-12-21 DIAGNOSIS — S61219A Laceration without foreign body of unspecified finger without damage to nail, initial encounter: Secondary | ICD-10-CM

## 2013-12-21 MED ORDER — ALPRAZOLAM 0.5 MG PO TABS: ORAL_TABLET | ORAL | Status: DC

## 2013-12-21 MED ORDER — CLONIDINE HCL 0.1 MG PO TABS: 0.1000 mg | ORAL_TABLET | Freq: Two times a day (BID) | ORAL | Status: DC

## 2013-12-21 MED ORDER — HYDROCHLOROTHIAZIDE 25 MG PO TABS: 25.0000 mg | ORAL_TABLET | Freq: Every day | ORAL | Status: DC

## 2013-12-21 MED ORDER — CEPHALEXIN 500 MG PO CAPS: 500.0000 mg | ORAL_CAPSULE | Freq: Two times a day (BID) | ORAL | Status: DC

## 2013-12-21 NOTE — Patient Instructions (Signed)
Please continue antibiotics. Follow up on Monday for suture removal.

## 2013-12-21 NOTE — Assessment & Plan Note (Signed)
Healing well, no sign of infection at this time. Continue keflex, follow up on Monday for suture removal. Given location of sutures, it think it is too soon to remove.

## 2013-12-21 NOTE — Addendum Note (Signed)
Addended by: Debbrah Alar on: 12/21/2013 07:36 AM   Modules accepted: Orders, Level of Service

## 2013-12-21 NOTE — Telephone Encounter (Signed)
Pt returned my call.  Reviewed below recommendations, she verbalizes understanding.

## 2013-12-21 NOTE — Assessment & Plan Note (Signed)
BP Readings from Last 3 Encounters:  12/21/13 160/90  12/17/13 160/78  12/16/13 163/93    BP remains uncontrolled.  She is maintained on Toprol xl, amlodipine, and hctz.  Will add clonidine.

## 2013-12-21 NOTE — Progress Notes (Signed)
Subjective:    Patient ID: Stephanie Montes, female    DOB: [birth year], 38 y.o.   MRN: [id number]  HPI  Stephanie Montes is a 38 yr old female who presents today for suture removal. She had sutures placed in the right finger on 7/18.  She was seen back in the office on 7/20 with complaint of pain/swelling and was placed on empiric abx.  Husband accidentally threw out 5 of her keflex tabs and she is requesting refill.    Review of Systems See HPI  Past Medical History  Diagnosis Date  . Ovarian cyst   . Hypertension   . Anxiety     History   Social History  . Marital Status: Married    Spouse Name: N/A    Number of Children: N/A  . Years of Education: N/A   Occupational History  . Not on file.   Social History Main Topics  . Smoking status: Former Smoker    Quit date: 03/10/2013  . Smokeless tobacco: Never Used     Comment: 8-9 cigareetes daily  . Alcohol Use: No  . Drug Use: No  . Sexual Activity: Yes    Birth Control/ Protection: Condom   Other Topics Concern  . Not on file   Social History Narrative   Married, 10 years   Has a step son who is 6- she has raised him   Not currently working- wants to find a job   She is working on an Biomedical scientist at [employer]. Husband is Surveyor, mining at [employer]   Has a sister who lives in [place]. Does not have a great relationship with her mom.   Enjoys reading/puzzles.      Past Surgical History  Procedure Laterality Date  . Laparoscopy    . Dental surgery  07/2012    tooth implant    Family History  Problem Relation Age of Onset  . Anesthesia problems Neg Hx   . Cancer Mother 80    breast  . Osteoporosis Mother   . Thyroid disease Mother     hypothyroidism / grave's disease  . Hyperlipidemia Mother   . Cancer Sister 75    breast  . Stroke Father   . Hypertension Father   . Aneurysm Father     brain  . Hyperlipidemia Father   . Sudden death Father   . Cancer Paternal Aunt     breast  . Cancer Maternal  Grandfather     ?bladder  . Cancer Cousin 42    breast  . Cancer Maternal Aunt     ?brain tumor  . Cancer Paternal Aunt     ovarian  . Cancer Cousin     brain tumors  . Alzheimer's disease Paternal Uncle   . Thyroid disease Maternal Grandmother     grave's disease    No Known Allergies  Current Outpatient Prescriptions on File Prior to Visit  Medication Sig Dispense Refill  . amLODipine (NORVASC) 10 MG tablet Take 1 tablet (10 mg total) by mouth daily.  30 tablet  1  . Cholecalciferol (VITAMIN D PO) Take 1 tablet by mouth daily.      . cyanocobalamin 100 MCG tablet Take 100 mcg by mouth 2 (two) times daily.      Marland Kitchen escitalopram (LEXAPRO) 20 MG tablet Take 1 tablet (20 mg total) by mouth daily.  30 tablet  1  . HYDROcodone-acetaminophen (NORCO) 10-325 MG per tablet Take 1 tablet by mouth as needed.      Marland Kitchen  ibuprofen (ADVIL,MOTRIN) 800 MG tablet Take 1 tablet (800 mg total) by mouth 3 (three) times daily.  21 tablet  0  . metoprolol succinate (TOPROL XL) 100 MG 24 hr tablet Take 1 tablet (100 mg total) by mouth daily. Take with or immediately following a meal.  30 tablet  0  . Multiple Vitamin (MULTIVITAMIN) capsule Take 1 capsule by mouth daily.      Marland Kitchen venlafaxine (EFFEXOR) 50 MG tablet Take 1 tablet (50 mg total) by mouth 2 (two) times daily.  60 tablet  1   No current facility-administered medications on file prior to visit.    BP 160/90  Pulse 78  Temp(Src) 98.1 F (36.7 C) (Oral)  Resp 16  Ht 5\' 5"  (1.651 m)  Wt 142 lb 0.6 oz (64.429 kg)  BMI 23.64 kg/m2  LMP 11/16/2013       Objective:   Physical Exam  Constitutional: She appears well-developed and well-nourished. No distress.  Skin:  R ring finger with some swelling, no erythema, sutures are intact.            Assessment & Plan:

## 2013-12-21 NOTE — Telephone Encounter (Signed)
Left message for pt requesting that she return our call. When she calls back please let her know that I reviewed her last few blood pressures and they have been high. I would like for her to add clonidine twice daily for blood pressure and we will repeat her BP on Monday when she returns for suture removal.

## 2013-12-21 NOTE — Progress Notes (Signed)
Pre visit review using our clinic review tool, if applicable. No additional management support is needed unless otherwise documented below in the visit note. 

## 2013-12-21 NOTE — ED Provider Notes (Signed)
Medical screening examination/treatment/procedure(s) were performed by non-physician practitioner and as supervising physician I was immediately available for consultation/collaboration.   EKG Interpretation None        Delice Bison Aasia Peavler, DO 12/21/13 1100

## 2013-12-24 ENCOUNTER — Encounter: Payer: Self-pay | Admitting: Family

## 2013-12-24 ENCOUNTER — Ambulatory Visit (INDEPENDENT_AMBULATORY_CARE_PROVIDER_SITE_OTHER): Payer: BC Managed Care – PPO | Admitting: Family

## 2013-12-24 VITALS — BP 140/90 | HR 91 | Temp 98.0°F | Resp 16 | Ht 65.0 in | Wt 137.0 lb

## 2013-12-24 DIAGNOSIS — Z5189 Encounter for other specified aftercare: Secondary | ICD-10-CM

## 2013-12-24 DIAGNOSIS — S61219D Laceration without foreign body of unspecified finger without damage to nail, subsequent encounter: Secondary | ICD-10-CM

## 2013-12-24 DIAGNOSIS — I10 Essential (primary) hypertension: Secondary | ICD-10-CM

## 2013-12-24 DIAGNOSIS — S61209A Unspecified open wound of unspecified finger without damage to nail, initial encounter: Secondary | ICD-10-CM

## 2013-12-24 NOTE — Progress Notes (Signed)
Pre visit review using our clinic review tool, if applicable. No additional management support is needed unless otherwise documented below in the visit note. 

## 2013-12-25 NOTE — Progress Notes (Signed)
Subjective:    Patient ID: Stephanie Montes, female    DOB: 1975-11-10, 38 y.o.   MRN: [id number]  HPI  Stephanie Montes is a 38 yr old female who presents today for suture removal.  She reports that her finger remains tender but that the swelling has improved. Still has some "itching" associated with the sutures.  HTN-  Clonidine was added last visit due to elevated BP. BP Readings from Last 3 Encounters:  12/24/13 140/90  12/21/13 160/90  12/17/13 160/78   Review of Systems    see HPI  Past Medical History  Diagnosis Date  . Ovarian cyst   . Hypertension   . Anxiety     History   Social History  . Marital Status: Married    Spouse Name: N/A    Number of Children: N/A  . Years of Education: N/A   Occupational History  . Not on file.   Social History Main Topics  . Smoking status: Former Smoker    Quit date: 03/10/2013  . Smokeless tobacco: Never Used     Comment: 8-9 cigareetes daily  . Alcohol Use: No  . Drug Use: No  . Sexual Activity: Yes    Birth Control/ Protection: Condom   Other Topics Concern  . Not on file   Social History Narrative   Married, 10 years   Has a step son who is 32- she has raised him   Not currently working- wants to find a job   She is working on an Biomedical scientist at [employer]. Husband is Surveyor, mining at [employer]   Has a sister who lives in [place]. Does not have a great relationship with her mom.   Enjoys reading/puzzles.      Past Surgical History  Procedure Laterality Date  . Laparoscopy    . Dental surgery  07/2012    tooth implant    Family History  Problem Relation Age of Onset  . Anesthesia problems Neg Hx   . Cancer Mother 88    breast  . Osteoporosis Mother   . Thyroid disease Mother     hypothyroidism / grave's disease  . Hyperlipidemia Mother   . Cancer Sister 58    breast  . Stroke Father   . Hypertension Father   . Aneurysm Father     brain  . Hyperlipidemia Father   . Sudden death Father   . Cancer  Paternal Aunt     breast  . Cancer Maternal Grandfather     ?bladder  . Cancer Cousin 42    breast  . Cancer Maternal Aunt     ?brain tumor  . Cancer Paternal Aunt     ovarian  . Cancer Cousin     brain tumors  . Alzheimer's disease Paternal Uncle   . Thyroid disease Maternal Grandmother     grave's disease    No Known Allergies  Current Outpatient Prescriptions on File Prior to Visit  Medication Sig Dispense Refill  . ALPRAZolam (XANAX) 0.5 MG tablet TAKE 1 TABLET BY MOUTH THREE TIMES A DAY AS NEEDED  90 tablet  0  . amLODipine (NORVASC) 10 MG tablet Take 1 tablet (10 mg total) by mouth daily.  30 tablet  1  . cephALEXin (KEFLEX) 500 MG capsule Take 1 capsule (500 mg total) by mouth 2 (two) times daily.  5 capsule  0  . Cholecalciferol (VITAMIN D PO) Take 1 tablet by mouth daily.      . cloNIDine (CATAPRES)  0.1 MG tablet Take 1 tablet (0.1 mg total) by mouth 2 (two) times daily.  60 tablet  0  . cyanocobalamin 100 MCG tablet Take 100 mcg by mouth 2 (two) times daily.      Marland Kitchen escitalopram (LEXAPRO) 20 MG tablet Take 1 tablet (20 mg total) by mouth daily.  30 tablet  1  . hydrochlorothiazide (HYDRODIURIL) 25 MG tablet Take 1 tablet (25 mg total) by mouth daily.  30 tablet  2  . HYDROcodone-acetaminophen (NORCO) 10-325 MG per tablet Take 1 tablet by mouth as needed.      Marland Kitchen ibuprofen (ADVIL,MOTRIN) 800 MG tablet Take 1 tablet (800 mg total) by mouth 3 (three) times daily.  21 tablet  0  . metoprolol succinate (TOPROL XL) 100 MG 24 hr tablet Take 1 tablet (100 mg total) by mouth daily. Take with or immediately following a meal.  30 tablet  0  . Multiple Vitamin (MULTIVITAMIN) capsule Take 1 capsule by mouth daily.      Marland Kitchen venlafaxine (EFFEXOR) 50 MG tablet Take 1 tablet (50 mg total) by mouth 2 (two) times daily.  60 tablet  1   No current facility-administered medications on file prior to visit.    BP 140/90  Pulse 91  Temp(Src) 98 F (36.7 C) (Oral)  Resp 16  Ht 5\' 5"  (1.651 m)   Wt 137 lb (62.143 kg)  BMI 22.80 kg/m2  SpO2 98%    Objective:   Physical Exam  Constitutional: She appears well-developed and well-nourished.  Skin:  3 sutures noted right ring finger- clean dry and intact without drainage or erythema. Wound margins well approximated.    Psychiatric: She has a normal mood and affect. Her behavior is normal. Judgment and thought content normal.          Assessment & Plan:

## 2013-12-25 NOTE — Assessment & Plan Note (Addendum)
Improved. 3 sutures are removed today. Pt tolerated removal without difficulty. Pt advised to keep wound clean and dry, apply antibiotic ointment once daily- call if increased redness/swelling/pain.  Pt verbalizes understanding.

## 2013-12-25 NOTE — Assessment & Plan Note (Signed)
BP is improved on clonidine. Continue same.

## 2014-01-09 ENCOUNTER — Encounter: Payer: Self-pay | Admitting: Family

## 2014-01-09 ENCOUNTER — Ambulatory Visit (INDEPENDENT_AMBULATORY_CARE_PROVIDER_SITE_OTHER): Payer: BC Managed Care – PPO | Admitting: Family

## 2014-01-09 ENCOUNTER — Telehealth: Payer: Self-pay | Admitting: Family

## 2014-01-09 VITALS — BP 146/98 | HR 97 | Temp 98.5°F | Resp 14 | Ht 65.0 in | Wt 139.0 lb

## 2014-01-09 DIAGNOSIS — M7989 Other specified soft tissue disorders: Secondary | ICD-10-CM

## 2014-01-09 DIAGNOSIS — IMO0001 Reserved for inherently not codable concepts without codable children: Secondary | ICD-10-CM

## 2014-01-09 NOTE — Progress Notes (Signed)
Pre visit review using our clinic review tool, if applicable. No additional management support is needed unless otherwise documented below in the visit note. 

## 2014-01-09 NOTE — Progress Notes (Signed)
Subjective:    Patient ID: Stephanie Montes, female    DOB: June 06, 1975, 38 y.o.   MRN: [id number]  HPI  Pt presents today to discuss hand pain.  Of note, computer system was down when pt was evaluated and when I asked her why she is here today she stated due to swelling of her finger. She did not mention nasal congestion or cough. She reports increased swelling on the dorsal aspect of the right ring finger at site of recent laceration.  She reports that pain has improved, but the swelling has worsened. She is concerned that she may be developing a cyst in the area of concern.    Review of Systems See HPI  Past Medical History  Diagnosis Date  . Ovarian cyst   . Hypertension   . Anxiety     History   Social History  . Marital Status: Married    Spouse Name: N/A    Number of Children: N/A  . Years of Education: N/A   Occupational History  . Not on file.   Social History Main Topics  . Smoking status: Former Smoker    Quit date: 03/10/2013  . Smokeless tobacco: Never Used     Comment: 8-9 cigareetes daily  . Alcohol Use: No  . Drug Use: No  . Sexual Activity: Yes    Birth Control/ Protection: Condom   Other Topics Concern  . Not on file   Social History Narrative   Married, 10 years   Has a step son who is 59- she has raised him   Not currently working- wants to find a job   She is working on an Biomedical scientist at [employer]. Husband is Surveyor, mining at [employer]   Has a sister who lives in [place]. Does not have a great relationship with her mom.   Enjoys reading/puzzles.      Past Surgical History  Procedure Laterality Date  . Laparoscopy    . Dental surgery  07/2012    tooth implant    Family History  Problem Relation Age of Onset  . Anesthesia problems Neg Hx   . Cancer Mother 18    breast  . Osteoporosis Mother   . Thyroid disease Mother     hypothyroidism / grave's disease  . Hyperlipidemia Mother   . Cancer Sister 34    breast  . Stroke Father   .  Hypertension Father   . Aneurysm Father     brain  . Hyperlipidemia Father   . Sudden death Father   . Cancer Paternal Aunt     breast  . Cancer Maternal Grandfather     ?bladder  . Cancer Cousin 42    breast  . Cancer Maternal Aunt     ?brain tumor  . Cancer Paternal Aunt     ovarian  . Cancer Cousin     brain tumors  . Alzheimer's disease Paternal Uncle   . Thyroid disease Maternal Grandmother     grave's disease    No Known Allergies  Current Outpatient Prescriptions on File Prior to Visit  Medication Sig Dispense Refill  . ALPRAZolam (XANAX) 0.5 MG tablet TAKE 1 TABLET BY MOUTH THREE TIMES A DAY AS NEEDED  90 tablet  0  . amLODipine (NORVASC) 10 MG tablet Take 1 tablet (10 mg total) by mouth daily.  30 tablet  1  . Cholecalciferol (VITAMIN D PO) Take 1 tablet by mouth daily.      . cloNIDine (CATAPRES)  0.1 MG tablet Take 1 tablet (0.1 mg total) by mouth 2 (two) times daily.  60 tablet  0  . cyanocobalamin 100 MCG tablet Take 100 mcg by mouth 2 (two) times daily.      Marland Kitchen escitalopram (LEXAPRO) 20 MG tablet Take 1 tablet (20 mg total) by mouth daily.  30 tablet  1  . hydrochlorothiazide (HYDRODIURIL) 25 MG tablet Take 1 tablet (25 mg total) by mouth daily.  30 tablet  2  . HYDROcodone-acetaminophen (NORCO) 10-325 MG per tablet Take 1 tablet by mouth as needed.      Marland Kitchen ibuprofen (ADVIL,MOTRIN) 800 MG tablet Take 1 tablet (800 mg total) by mouth 3 (three) times daily.  21 tablet  0  . metoprolol succinate (TOPROL XL) 100 MG 24 hr tablet Take 1 tablet (100 mg total) by mouth daily. Take with or immediately following a meal.  30 tablet  0  . Multiple Vitamin (MULTIVITAMIN) capsule Take 1 capsule by mouth daily.      Marland Kitchen venlafaxine (EFFEXOR) 50 MG tablet Take 1 tablet (50 mg total) by mouth 2 (two) times daily.  60 tablet  1   No current facility-administered medications on file prior to visit.    BP 146/98  Pulse 97  Temp(Src) 98.5 F (36.9 C)  Resp 14  Ht 5\' 5"  (1.651 m)   Wt 139 lb (63.05 kg)  BMI 23.13 kg/m2  SpO2 99%  LMP 01/02/2014       Objective:   Physical Exam  Constitutional: She appears well-developed and well-nourished. No distress.  Musculoskeletal:  + swelling at the base of the right ring finger. Non tender.  Area beneath swelling is firm.  Decreased ROM of finger due to swelling          Assessment & Plan:

## 2014-01-09 NOTE — Telephone Encounter (Signed)
See my chart message

## 2014-01-09 NOTE — Assessment & Plan Note (Signed)
Will refer to hand specialist for further evaluation.

## 2014-01-11 ENCOUNTER — Telehealth: Payer: Self-pay | Admitting: Family

## 2014-01-11 NOTE — Telephone Encounter (Signed)
Notified pt and she voices understanding. 

## 2014-01-11 NOTE — Telephone Encounter (Signed)
Left message for pt to return my call.

## 2014-01-11 NOTE — Telephone Encounter (Signed)
Please see unread mychart message and contact pt. Thanks.

## 2014-01-18 ENCOUNTER — Encounter: Payer: Self-pay | Admitting: Physician Assistant

## 2014-01-18 ENCOUNTER — Ambulatory Visit (INDEPENDENT_AMBULATORY_CARE_PROVIDER_SITE_OTHER): Payer: BC Managed Care – PPO | Admitting: Physician Assistant

## 2014-01-18 VITALS — BP 136/86 | HR 100 | Temp 98.7°F | Resp 16 | Ht 65.0 in | Wt 136.8 lb

## 2014-01-18 DIAGNOSIS — I10 Essential (primary) hypertension: Secondary | ICD-10-CM

## 2014-01-18 DIAGNOSIS — F411 Generalized anxiety disorder: Secondary | ICD-10-CM

## 2014-01-18 DIAGNOSIS — F419 Anxiety disorder, unspecified: Secondary | ICD-10-CM

## 2014-01-18 MED ORDER — ALPRAZOLAM 0.5 MG PO TABS: ORAL_TABLET | ORAL | Status: DC

## 2014-01-18 MED ORDER — CLONIDINE HCL 0.1 MG PO TABS: 0.1000 mg | ORAL_TABLET | Freq: Two times a day (BID) | ORAL | Status: DC

## 2014-01-18 NOTE — Assessment & Plan Note (Signed)
Repeat BP within normal limits.  Continue current regimen.  Medications refilled.  Encouraged DASH diet.  Follow-up with Melissa as scheduled.

## 2014-01-18 NOTE — Progress Notes (Signed)
Patient presents to clinic today for a BP recheck since initiation of Catapres to help with her HTN.  Patient denies symptoms of chest pain, palpitations, lightheadedness, vision changes or headache. Endorses checking BP at home with readings in 120s-130s/80s.  First BP check in clinic mildly elevated.  Will need to be repeated at end of visit as patient states she ran up the stairs to get here on time.  Patient needs refills of Catapres and her Xanax.  Past Medical History  Diagnosis Date  . Ovarian cyst   . Hypertension   . Anxiety     Current Outpatient Prescriptions on File Prior to Visit  Medication Sig Dispense Refill  . amLODipine (NORVASC) 10 MG tablet Take 1 tablet (10 mg total) by mouth daily.  30 tablet  1  . Cholecalciferol (VITAMIN D PO) Take 1 tablet by mouth daily.      . cyanocobalamin 100 MCG tablet Take 100 mcg by mouth 2 (two) times daily.      Marland Kitchen escitalopram (LEXAPRO) 20 MG tablet Take 1 tablet (20 mg total) by mouth daily.  30 tablet  1  . hydrochlorothiazide (HYDRODIURIL) 25 MG tablet Take 1 tablet (25 mg total) by mouth daily.  30 tablet  2  . metoprolol succinate (TOPROL XL) 100 MG 24 hr tablet Take 1 tablet (100 mg total) by mouth daily. Take with or immediately following a meal.  30 tablet  0  . Multiple Vitamin (MULTIVITAMIN) capsule Take 1 capsule by mouth daily.      Marland Kitchen oxyCODONE-acetaminophen (PERCOCET) 10-325 MG per tablet Take 1 tablet by mouth 3 (three) times daily as needed.      . venlafaxine (EFFEXOR) 50 MG tablet Take 1 tablet (50 mg total) by mouth 2 (two) times daily.  60 tablet  1   No current facility-administered medications on file prior to visit.    No Known Allergies  Family History  Problem Relation Age of Onset  . Anesthesia problems Neg Hx   . Cancer Mother 29    breast  . Osteoporosis Mother   . Thyroid disease Mother     hypothyroidism / grave's disease  . Hyperlipidemia Mother   . Cancer Sister 71    breast  . Stroke Father   .  Hypertension Father   . Aneurysm Father     brain  . Hyperlipidemia Father   . Sudden death Father   . Cancer Paternal Aunt     breast  . Cancer Maternal Grandfather     ?bladder  . Cancer Cousin 42    breast  . Cancer Maternal Aunt     ?brain tumor  . Cancer Paternal Aunt     ovarian  . Cancer Cousin     brain tumors  . Alzheimer's disease Paternal Uncle   . Thyroid disease Maternal Grandmother     grave's disease    History   Social History  . Marital Status: Married    Spouse Name: N/A    Number of Children: N/A  . Years of Education: N/A   Social History Main Topics  . Smoking status: Former Smoker    Quit date: 03/10/2013  . Smokeless tobacco: Never Used     Comment: 8-9 cigareetes daily  . Alcohol Use: No  . Drug Use: No  . Sexual Activity: Yes    Birth Control/ Protection: Condom   Other Topics Concern  . None   Social History Narrative   Married, 10 years   Has  a step son who is 69- she has raised him   Not currently working- wants to find a job   She is working on an Biomedical scientist at [employer]. Husband is Surveyor, mining at [employer]   Has a sister who lives in [place]. Does not have a great relationship with her mom.   Enjoys reading/puzzles.     Review of Systems - See HPI.  All other ROS are negative.  BP 136/86  Pulse 100  Temp(Src) 98.7 F (37.1 C) (Oral)  Resp 16  Ht 5\' 5"  (1.651 m)  Wt 136 lb 12 oz (62.029 kg)  BMI 22.76 kg/m2  SpO2 99%  LMP 01/02/2014  Physical Exam  Vitals reviewed. Constitutional: She is oriented to person, place, and time and well-developed, well-nourished, and in no distress.  HENT:  Head: Normocephalic and atraumatic.  Neck: Neck supple.  Cardiovascular: Normal rate, regular rhythm, normal heart sounds and intact distal pulses.   Pulmonary/Chest: Effort normal and breath sounds normal. No respiratory distress. She has no wheezes. She has no rales. She exhibits no tenderness.  Neurological: She is alert and  oriented to person, place, and time.  Skin: Skin is warm and dry. No rash noted.  Psychiatric: Affect normal.   Assessment/Plan: HYPERTENSION Repeat BP within normal limits.  Continue current regimen.  Medications refilled.  Encouraged DASH diet.  Follow-up with Melissa as scheduled.  Anxiety Well-controlled. Continue current regimen.

## 2014-01-18 NOTE — Assessment & Plan Note (Signed)
Well-controlled.  Continue current regimen. 

## 2014-01-18 NOTE — Progress Notes (Signed)
Pre visit review using our clinic review tool, if applicable. No additional management support is needed unless otherwise documented below in the visit note/SLS  

## 2014-01-18 NOTE — Patient Instructions (Addendum)
Continue current medications as directed.  Limit salt intake.  Follow-up with Melissa as scheduled.    DASH Eating Plan DASH stands for "Dietary Approaches to Stop Hypertension." The DASH eating plan is a healthy eating plan that has been shown to reduce high blood pressure (hypertension). Additional health benefits may include reducing the risk of type 2 diabetes mellitus, heart disease, and stroke. The DASH eating plan may also help with weight loss. WHAT DO I NEED TO KNOW ABOUT THE DASH EATING PLAN? For the DASH eating plan, you will follow these general guidelines:  Choose foods with a percent daily value for sodium of less than 5% (as listed on the food label).  Use salt-free seasonings or herbs instead of table salt or sea salt.  Check with your health care provider or pharmacist before using salt substitutes.  Eat lower-sodium products, often labeled as "lower sodium" or "no salt added."  Eat fresh foods.  Eat more vegetables, fruits, and low-fat dairy products.  Choose whole grains. Look for the word "whole" as the first word in the ingredient list.  Choose fish and skinless chicken or [place] more often than red meat. Limit fish, poultry, and meat to 6 oz (170 g) each day.  Limit sweets, desserts, sugars, and sugary drinks.  Choose heart-healthy fats.  Limit cheese to 1 oz (28 g) per day.  Eat more home-cooked food and less restaurant, buffet, and fast food.  Limit fried foods.  Cook foods using methods other than frying.  Limit canned vegetables. If you do use them, rinse them well to decrease the sodium.  When eating at a restaurant, ask that your food be prepared with less salt, or no salt if possible. WHAT FOODS CAN I EAT? Seek help from a dietitian for individual calorie needs. Grains Whole grain or whole wheat bread. Brown rice. Whole grain or whole wheat pasta. Quinoa, bulgur, and whole grain cereals. Low-sodium cereals. Corn or whole wheat flour tortillas.  Whole grain cornbread. Whole grain crackers. Low-sodium crackers. Vegetables Fresh or frozen vegetables (raw, steamed, roasted, or grilled). Low-sodium or reduced-sodium tomato and vegetable juices. Low-sodium or reduced-sodium tomato sauce and paste. Low-sodium or reduced-sodium canned vegetables.  Fruits All fresh, canned (in natural juice), or frozen fruits. Meat and Other Protein Products Ground beef (85% or leaner), grass-fed beef, or beef trimmed of fat. Skinless chicken or [place]. Ground chicken or [place]. Pork trimmed of fat. All fish and seafood. Eggs. Dried beans, peas, or lentils. Unsalted nuts and seeds. Unsalted canned beans. Dairy Low-fat dairy products, such as skim or 1% milk, 2% or reduced-fat cheeses, low-fat ricotta or cottage cheese, or plain low-fat yogurt. Low-sodium or reduced-sodium cheeses. Fats and Oils Tub margarines without trans fats. Light or reduced-fat mayonnaise and salad dressings (reduced sodium). Avocado. Safflower, olive, or canola oils. Natural peanut or almond butter. Other Unsalted popcorn and pretzels. The items listed above may not be a complete list of recommended foods or beverages. Contact your dietitian for more options. WHAT FOODS ARE NOT RECOMMENDED? Grains White bread. White pasta. White rice. Refined cornbread. Bagels and croissants. Crackers that contain trans fat. Vegetables Creamed or fried vegetables. Vegetables in a cheese sauce. Regular canned vegetables. Regular canned tomato sauce and paste. Regular tomato and vegetable juices. Fruits Dried fruits. Canned fruit in light or heavy syrup. Fruit juice. Meat and Other Protein Products Fatty cuts of meat. Ribs, chicken wings, bacon, sausage, bologna, salami, chitterlings, fatback, hot dogs, bratwurst, and packaged luncheon meats. Salted nuts and seeds. Canned  beans with salt. Dairy Whole or 2% milk, cream, half-and-half, and cream cheese. Whole-fat or sweetened yogurt. Full-fat cheeses or  blue cheese. Nondairy creamers and whipped toppings. Processed cheese, cheese spreads, or cheese curds. Condiments Onion and garlic salt, seasoned salt, table salt, and sea salt. Canned and packaged gravies. Worcestershire sauce. Tartar sauce. Barbecue sauce. Teriyaki sauce. Soy sauce, including reduced sodium. Steak sauce. Fish sauce. Oyster sauce. Cocktail sauce. Horseradish. Ketchup and mustard. Meat flavorings and tenderizers. Bouillon cubes. Hot sauce. Tabasco sauce. Marinades. Taco seasonings. Relishes. Fats and Oils Butter, stick margarine, lard, shortening, ghee, and bacon fat. Coconut, palm kernel, or palm oils. Regular salad dressings. Other Pickles and olives. Salted popcorn and pretzels. The items listed above may not be a complete list of foods and beverages to avoid. Contact your dietitian for more information. WHERE CAN I FIND MORE INFORMATION? National Heart, Lung, and [employer] Institute: travelstabloid.com Document Released: 05/06/2011 Document Revised: 10/01/2013 Document Reviewed: 03/21/2013 [facility] Patient Information 2015 [place], [place]. This information is not intended to replace advice given to you by your health care provider. Make sure you discuss any questions you have with your health care provider.

## 2014-01-20 ENCOUNTER — Encounter (HOSPITAL_BASED_OUTPATIENT_CLINIC_OR_DEPARTMENT_OTHER): Payer: Self-pay | Admitting: Emergency Medicine

## 2014-01-20 ENCOUNTER — Emergency Department (HOSPITAL_BASED_OUTPATIENT_CLINIC_OR_DEPARTMENT_OTHER): Payer: BC Managed Care – PPO

## 2014-01-20 ENCOUNTER — Emergency Department (HOSPITAL_BASED_OUTPATIENT_CLINIC_OR_DEPARTMENT_OTHER)
Admission: EM | Admit: 2014-01-20 | Discharge: 2014-01-20 | Disposition: A | Discharge status: Home / Self Care | Payer: BC Managed Care – PPO | Attending: Emergency Medicine | Admitting: Emergency Medicine

## 2014-01-20 DIAGNOSIS — Z79899 Other long term (current) drug therapy: Secondary | ICD-10-CM | POA: Insufficient documentation

## 2014-01-20 DIAGNOSIS — IMO0002 Reserved for concepts with insufficient information to code with codable children: Secondary | ICD-10-CM | POA: Insufficient documentation

## 2014-01-20 DIAGNOSIS — F411 Generalized anxiety disorder: Secondary | ICD-10-CM | POA: Insufficient documentation

## 2014-01-20 DIAGNOSIS — R296 Repeated falls: Secondary | ICD-10-CM | POA: Insufficient documentation

## 2014-01-20 DIAGNOSIS — Z8742 Personal history of other diseases of the female genital tract: Secondary | ICD-10-CM | POA: Diagnosis not present

## 2014-01-20 DIAGNOSIS — Z87891 Personal history of nicotine dependence: Secondary | ICD-10-CM | POA: Diagnosis not present

## 2014-01-20 DIAGNOSIS — Y9289 Other specified places as the place of occurrence of the external cause: Secondary | ICD-10-CM | POA: Diagnosis not present

## 2014-01-20 DIAGNOSIS — S99929A Unspecified injury of unspecified foot, initial encounter: Secondary | ICD-10-CM

## 2014-01-20 DIAGNOSIS — I10 Essential (primary) hypertension: Secondary | ICD-10-CM | POA: Insufficient documentation

## 2014-01-20 DIAGNOSIS — S82899A Other fracture of unspecified lower leg, initial encounter for closed fracture: Secondary | ICD-10-CM | POA: Diagnosis not present

## 2014-01-20 DIAGNOSIS — S8990XA Unspecified injury of unspecified lower leg, initial encounter: Secondary | ICD-10-CM | POA: Insufficient documentation

## 2014-01-20 DIAGNOSIS — Y93K1 Activity, walking an animal: Secondary | ICD-10-CM | POA: Insufficient documentation

## 2014-01-20 DIAGNOSIS — S99919A Unspecified injury of unspecified ankle, initial encounter: Secondary | ICD-10-CM

## 2014-01-20 DIAGNOSIS — S82831A Other fracture of upper and lower end of right fibula, initial encounter for closed fracture: Secondary | ICD-10-CM

## 2014-01-20 MED ORDER — HYDROMORPHONE HCL PF 1 MG/ML IJ SOLN
2.0000 mg | Freq: Once | INTRAMUSCULAR | Status: AC
  Administered 2014-01-20: 2 mg via INTRAMUSCULAR
  Filled 2014-01-20: qty 2

## 2014-01-20 NOTE — ED Provider Notes (Signed)
CSN: [id number]     Arrival date & time 01/20/14  1025 History   First MD Initiated Contact with Patient 01/20/14 0825     Chief Complaint  Patient presents with  . Fall     (Consider location/radiation/quality/duration/timing/severity/associated sxs/prior Treatment) HPI 38 year old female presents after falling while walking the dog. She states she stepped in a hole. She's unsure she twisted her ankle. She's now having ankle and foot pain. When asked specifically where she points to her entire foot and ankle. She also suffered a small abrasion to her right wrist in her upper lip. Did not lose consciousness. She states that she was walking her dog at 2 AM. This is normal for her as the daughter has to go frequently. She states the abrasion to her lipids when she was getting out of the hole she fell and dog's leash. No loss of consciousness. Denies weakness or numbness but states her entire foot feels "weird". She has been unable to ambulate on that foot since the injury. Her son dropped her off. She took one of her 10/325 oxycodone with no relief. Pain is currently severe 9/10.  Past Medical History  Diagnosis Date  . Ovarian cyst   . Hypertension   . Anxiety    Past Surgical History  Procedure Laterality Date  . Laparoscopy    . Dental surgery  07/2012    tooth implant   Family History  Problem Relation Age of Onset  . Anesthesia problems Neg Hx   . Cancer Mother 42    breast  . Osteoporosis Mother   . Thyroid disease Mother     hypothyroidism / grave's disease  . Hyperlipidemia Mother   . Cancer Sister 69    breast  . Stroke Father   . Hypertension Father   . Aneurysm Father     brain  . Hyperlipidemia Father   . Sudden death Father   . Cancer Paternal Aunt     breast  . Cancer Maternal Grandfather     ?bladder  . Cancer Cousin 42    breast  . Cancer Maternal Aunt     ?brain tumor  . Cancer Paternal Aunt     ovarian  . Cancer Cousin     brain tumors  .  Alzheimer's disease Paternal Uncle   . Thyroid disease Maternal Grandmother     grave's disease   History  Substance Use Topics  . Smoking status: Former Smoker    Quit date: 03/10/2013  . Smokeless tobacco: Never Used     Comment: 8-9 cigareetes daily  . Alcohol Use: No   OB History   Grav Para Term Preterm Abortions TAB SAB Ect Mult Living   1 0 0 0 1 0 1 0 0 0      Review of Systems  Musculoskeletal: Positive for joint swelling.  Skin: Positive for wound.  Neurological: Negative for weakness and numbness.  All other systems reviewed and are negative.     Allergies  Review of patient's allergies indicates no known allergies.  Home Medications   Prior to Admission medications   Medication Sig Start Date End Date Taking? Authorizing Provider  ALPRAZolam Duanne Moron) 0.5 MG tablet TAKE 1 TABLET BY MOUTH THREE TIMES A DAY AS NEEDED 01/18/14   Brunetta Jeans, PA-C  amLODipine (NORVASC) 10 MG tablet Take 1 tablet (10 mg total) by mouth daily. 06/05/13   Debbrah Alar, NP  Cholecalciferol (VITAMIN D PO) Take 1 tablet by mouth daily.  Historical Provider, MD  cloNIDine (CATAPRES) 0.1 MG tablet Take 1 tablet (0.1 mg total) by mouth 2 (two) times daily. 01/18/14   Brunetta Jeans, PA-C  cyanocobalamin 100 MCG tablet Take 100 mcg by mouth 2 (two) times daily.    Historical Provider, MD  escitalopram (LEXAPRO) 20 MG tablet Take 1 tablet (20 mg total) by mouth daily. 06/05/13   Debbrah Alar, NP  hydrochlorothiazide (HYDRODIURIL) 25 MG tablet Take 1 tablet (25 mg total) by mouth daily. 12/21/13   Debbrah Alar, NP  loratadine (CLARITIN) 10 MG tablet Take 10 mg by mouth daily as needed for allergies.    Historical Provider, MD  metoprolol succinate (TOPROL XL) 100 MG 24 hr tablet Take 1 tablet (100 mg total) by mouth daily. Take with or immediately following a meal. 10/26/13   Debbrah Alar, NP  Multiple Vitamin (MULTIVITAMIN) capsule Take 1 capsule by mouth daily.     Historical Provider, MD  oxyCODONE-acetaminophen (PERCOCET) 10-325 MG per tablet Take 1 tablet by mouth 3 (three) times daily as needed. 01/04/14   Historical Provider, MD  venlafaxine (EFFEXOR) 50 MG tablet Take 1 tablet (50 mg total) by mouth 2 (two) times daily. 11/23/13   Brunetta Jeans, PA-C   BP 114/67  Pulse 77  Temp(Src) 97.6 F (36.4 C) (Oral)  Resp 18  Ht 5\' 4"  (1.626 m)  Wt 136 lb (61.689 kg)  BMI 23.33 kg/m2  SpO2 98%  LMP 01/02/2014 Physical Exam  Nursing note and vitals reviewed. Constitutional: She is oriented to person, place, and time. She appears well-developed and well-nourished.  HENT:  Head: Normocephalic.  Right Ear: External ear normal.  Left Ear: External ear normal.  Nose: Nose normal.  Mouth/Throat:    Eyes: Right eye exhibits no discharge. Left eye exhibits no discharge.  Cardiovascular: Normal rate and intact distal pulses.   Pulses:      Radial pulses are 2+ on the right side.       Dorsalis pedis pulses are 2+ on the right side.       Posterior tibial pulses are 2+ on the right side.  Pulmonary/Chest: Effort normal.  Abdominal: She exhibits no distension.  Musculoskeletal:       Right wrist: She exhibits normal range of motion and no tenderness.       Right ankle: She exhibits swelling. She exhibits no deformity and normal pulse. Tenderness. Lateral malleolus tenderness found. Achilles tendon exhibits no pain.       Arms:      Right foot: She exhibits tenderness. She exhibits no swelling and no deformity.  Neurological: She is alert and oriented to person, place, and time.  Normal gross sensation to bilateral feet  Skin: Skin is warm and dry.    ED Course  Procedures (including critical care time) Labs Review Labs Reviewed - No data to display  Imaging Review Dg Ankle Complete Right  01/20/2014   CLINICAL DATA:  Right ankle pain after injury.  EXAM: RIGHT ANKLE - COMPLETE 3+ VIEW  COMPARISON:  None.  FINDINGS: Several small bone fragments  are seen distal to the distal fibula consistent with avulsion fracture of indeterminate age. Overlying soft tissue swelling is noted. Talar dome is intact.  IMPRESSION: Several small bone fragments are seen distal to the distal fibula consistent with avulsion fracture of indeterminate age.   Electronically Signed   By: Sabino Dick M.D.   On: 01/20/2014 09:30   Dg Foot Complete Right  01/20/2014   CLINICAL DATA:  Twisting injury, pain  EXAM: RIGHT FOOT COMPLETE - 3+ VIEW  COMPARISON:  01/21/2012  FINDINGS: Normal alignment without fracture. Preserved joint spaces. No significant arthropathy. No soft tissue abnormality.  IMPRESSION: No acute findings   Electronically Signed   By: Daryll Brod M.D.   On: 01/20/2014 09:25     EKG Interpretation None      MDM   Final diagnoses:  Closed avulsion fracture of distal fibula, right, initial encounter    She with possible distal fibula avulsion fracture based on x-rays. Given this could be acute we'll put in splint, given crutches, and ortho followup. No other signs of significant injury.    Ephraim Hamburger, MD 01/20/14 [phone]

## 2014-01-20 NOTE — Discharge Instructions (Signed)
Ankle Fracture A fracture is a break in a bone. The ankle joint is made up of three bones. These include the lower (distal)sections of your lower leg bones, called the tibia and fibula, along with a bone in your foot, called the talus. Depending on how bad the break is and if more than one ankle joint bone is broken, a cast or splint is used to protect and keep your injured bone from moving while it heals. Sometimes, surgery is required to help the fracture heal properly.  There are two general types of fractures:  Stable fracture. This includes a single fracture line through one bone, with no injury to ankle ligaments. A fracture of the talus that does not have any displacement (movement of the bone on either side of the fracture line) is also stable.  Unstable fracture. This includes more than one fracture line through one or more bones in the ankle joint. It also includes fractures that have displacement of the bone on either side of the fracture line. CAUSES  A direct blow to the ankle.   Quickly and severely twisting your ankle.  Trauma, such as a car accident or falling from a significant height. RISK FACTORS You may be at a higher risk of ankle fracture if:  You have certain medical conditions.  You are involved in high-impact sports.  You are involved in a high-impact car accident. SIGNS AND SYMPTOMS   Tender and swollen ankle.  Bruising around the injured ankle.  Pain on movement of the ankle.  Difficulty walking or putting weight on the ankle.  A cold foot below the site of the ankle injury. This can occur if the blood vessels passing through your injured ankle were also damaged.  Numbness in the foot below the site of the ankle injury. DIAGNOSIS  An ankle fracture is usually diagnosed with a physical exam and X-rays. A CT scan may also be required for complex fractures. TREATMENT  Stable fractures are treated with a cast or splint and using crutches to avoid putting  weight on your injured ankle. This is followed by an ankle strengthening program. Some patients require a special type of cast, depending on other medical problems they may have. Unstable fractures require surgery to ensure the bones heal properly. Your health care provider will tell you what type of fracture you have and the best treatment for your condition. HOME CARE INSTRUCTIONS   Review correct crutch use with your health care provider and use your crutches as directed. Safe use of crutches is extremely important. Misuse of crutches can cause you to fall or cause injury to nerves in your hands or armpits.  Do not put weight or pressure on the injured ankle until directed by your health care provider.  To lessen the swelling, keep the injured leg elevated while sitting or lying down.  Apply ice to the injured area:  Put ice in a plastic bag.  Place a towel between your cast and the bag.  Leave the ice on for 20 minutes, 2-3 times a day.  If you have a plaster or fiberglass cast:  Do not try to scratch the skin under the cast with any objects. This can increase your risk of skin infection.  Check the skin around the cast every day. You may put lotion on any red or sore areas.  Keep your cast dry and clean.  If you have a plaster splint:  Wear the splint as directed.  You may loosen the elastic  around the splint if your toes become numb, tingle, or turn cold or blue. °· Do not put pressure on any part of your cast or splint; it may break. Rest your cast only on a pillow the first 24 hours until it is fully hardened. °· Your cast or splint can be protected during bathing with a plastic bag sealed to your skin with medical tape. Do not lower the cast or splint into water. °· Take medicines as directed by your health care provider. Only take over-the-counter or prescription medicines for pain, discomfort, or fever as directed by your health care provider. °· Do not drive a vehicle until  your health care provider specifically tells you it is safe to do so. °· If your health care provider has given you a follow-up appointment, it is very important to keep that appointment. Not keeping the appointment could result in a chronic or permanent injury, pain, and disability. If you have any problem keeping the appointment, call the facility for assistance. °SEEK MEDICAL CARE IF: °You develop increased swelling or discomfort. °SEEK IMMEDIATE MEDICAL CARE IF:  °· Your cast gets damaged or breaks. °· You have continued severe pain. °· You develop new pain or swelling after the cast was put on. °· Your skin or toenails below the injury turn blue or gray. °· Your skin or toenails below the injury feel cold, numb, or have loss of sensitivity to touch. °· There is a bad smell or pus draining from under the cast. °MAKE SURE YOU:  °· Understand these instructions. °· Will watch your condition. °· Will get help right away if you are not doing well or get worse. °Document Released: 05/14/2000 Document Revised: 05/22/2013 Document Reviewed: 12/14/2012 °Exit[employer]® Patient Information ©2015 [employer]. This information is not intended to replace advice given to you by your health care provider. Make sure you discuss any questions you have with your health care provider. °Cast or Splint Care °Casts and splints support injured limbs and keep bones from moving while they heal. It is important to care for your cast or splint at home.   °HOME CARE INSTRUCTIONS °· Keep the cast or splint uncovered during the drying period. It can take 24 to 48 hours to dry if it is made of plaster. A fiberglass cast will dry in less than 1 hour. °· Do not rest the cast on anything harder than a pillow for the first 24 hours. °· Do not put weight on your injured limb or apply pressure to the cast until your health care provider gives you permission. °· Keep the cast or splint dry. Wet casts or splints can lose their shape and may not support  the limb as well. A wet cast that has lost its shape can also create harmful pressure on your skin when it dries. Also, wet skin can become infected. °¨ Cover the cast or splint with a plastic bag when bathing or when out in the rain or snow. If the cast is on the trunk of the body, take sponge baths until the cast is removed. °¨ If your cast does become wet, dry it with a towel or a blow dryer on the cool setting only. °· Keep your cast or splint clean. Soiled casts may be wiped with a moistened cloth. °· Do not place any hard or soft foreign objects under your cast or splint, such as cotton, toilet paper, lotion, or powder. °· Do not try to scratch the skin under the cast with any   object. The object could get stuck inside the cast. Also, scratching could lead to an infection. If itching is a problem, use a blow dryer on a cool setting to relieve discomfort. °· Do not trim or cut your cast or remove padding from inside of it. °· Exercise all joints next to the injury that are not immobilized by the cast or splint. For example, if you have a long leg cast, exercise the hip joint and toes. If you have an arm cast or splint, exercise the shoulder, elbow, thumb, and fingers. °· Elevate your injured arm or leg on 1 or 2 pillows for the first 1 to 3 days to decrease swelling and pain. It is best if you can comfortably elevate your cast so it is higher than your heart. °SEEK MEDICAL CARE IF:  °· Your cast or splint cracks. °· Your cast or splint is too tight or too loose. °· You have unbearable itching inside the cast. °· Your cast becomes wet or develops a soft spot or area. °· You have a bad smell coming from inside your cast. °· You get an object stuck under your cast. °· Your skin around the cast becomes red or raw. °· You have new pain or worsening pain after the cast has been applied. °SEEK IMMEDIATE MEDICAL CARE IF:  °· You have fluid leaking through the cast. °· You are unable to move your fingers or toes. °· You  have discolored (blue or white), cool, painful, or very swollen fingers or toes beyond the cast. °· You have tingling or numbness around the injured area. °· You have severe pain or pressure under the cast. °· You have any difficulty with your breathing or have shortness of breath. °· You have chest pain. °Document Released: 05/14/2000 Document Revised: 03/07/2013 Document Reviewed: 11/23/2012 °ExitCare® Patient Information ©2015 [employer]. This information is not intended to replace advice given to you by your health care provider. Make sure you discuss any questions you have with your health care provider. ° °

## 2014-01-20 NOTE — ED Notes (Signed)
Patient fell while walking dog, c/o R foot and ankle pain, abrasion to R wrist and upper lip

## 2014-01-21 ENCOUNTER — Encounter: Payer: Self-pay | Admitting: Family Medicine

## 2014-01-21 ENCOUNTER — Ambulatory Visit (INDEPENDENT_AMBULATORY_CARE_PROVIDER_SITE_OTHER): Payer: BC Managed Care – PPO | Admitting: Family Medicine

## 2014-01-21 VITALS — BP 125/81 | HR 98 | Ht 64.0 in | Wt 136.0 lb

## 2014-01-21 DIAGNOSIS — S99929A Unspecified injury of unspecified foot, initial encounter: Secondary | ICD-10-CM

## 2014-01-21 DIAGNOSIS — S99919A Unspecified injury of unspecified ankle, initial encounter: Secondary | ICD-10-CM

## 2014-01-21 DIAGNOSIS — S99911A Unspecified injury of right ankle, initial encounter: Secondary | ICD-10-CM

## 2014-01-21 DIAGNOSIS — S8990XA Unspecified injury of unspecified lower leg, initial encounter: Secondary | ICD-10-CM

## 2014-01-21 NOTE — Patient Instructions (Signed)
You have an avulsion fracture of your distal fibula. This is treated like a severe ankle sprain. Ice the area for 15 minutes at a time, 3-4 times a day Aleve 2 tabs twice a day with food OR ibuprofen 3 tabs three times a day with food for pain and inflammation. Continue your percocet as you have been. Elevate above the level of your heart when possible Crutches if needed to help with walking Bear weight when tolerated Use walking boot when up and walking around to help with stability while you recover from this injury. Follow up with me in 2 weeks for reevaluation. Hopefully we will add some home motion exercises at that time.

## 2014-01-22 ENCOUNTER — Encounter: Payer: Self-pay | Admitting: Family Medicine

## 2014-01-22 DIAGNOSIS — S99911A Unspecified injury of right ankle, initial encounter: Secondary | ICD-10-CM | POA: Insufficient documentation

## 2014-01-22 NOTE — Progress Notes (Signed)
Patient ID: Stephanie Montes, female   DOB: [birth year], 38 y.o.   MRN: [id number]  PCP: Nance Pear., NP  Subjective:   HPI: Patient is a 38 y.o. female here for right ankle pain.  Patient reports on 8/22 she was walking her dog when she turned her ankle in a hole. Couldn't bear weight following this. Placed in a splint and crutches by ED after x-rays showed avulsion fracture of distal fibula. + swelling and bruising. No prior injuries to this ankle.  Past Medical History  Diagnosis Date  . Ovarian cyst   . Hypertension   . Anxiety     Current Outpatient Prescriptions on File Prior to Visit  Medication Sig Dispense Refill  . ALPRAZolam (XANAX) 0.5 MG tablet TAKE 1 TABLET BY MOUTH THREE TIMES A DAY AS NEEDED  90 tablet  0  . amLODipine (NORVASC) 10 MG tablet Take 1 tablet (10 mg total) by mouth daily.  30 tablet  1  . Cholecalciferol (VITAMIN D PO) Take 1 tablet by mouth daily.      . cloNIDine (CATAPRES) 0.1 MG tablet Take 1 tablet (0.1 mg total) by mouth 2 (two) times daily.  60 tablet  0  . cyanocobalamin 100 MCG tablet Take 100 mcg by mouth 2 (two) times daily.      Marland Kitchen escitalopram (LEXAPRO) 20 MG tablet Take 1 tablet (20 mg total) by mouth daily.  30 tablet  1  . hydrochlorothiazide (HYDRODIURIL) 25 MG tablet Take 1 tablet (25 mg total) by mouth daily.  30 tablet  2  . loratadine (CLARITIN) 10 MG tablet Take 10 mg by mouth daily as needed for allergies.      . metoprolol succinate (TOPROL XL) 100 MG 24 hr tablet Take 1 tablet (100 mg total) by mouth daily. Take with or immediately following a meal.  30 tablet  0  . Multiple Vitamin (MULTIVITAMIN) capsule Take 1 capsule by mouth daily.      Marland Kitchen oxyCODONE-acetaminophen (PERCOCET) 10-325 MG per tablet Take 1 tablet by mouth 3 (three) times daily as needed.      . venlafaxine (EFFEXOR) 50 MG tablet Take 1 tablet (50 mg total) by mouth 2 (two) times daily.  60 tablet  1   No current facility-administered medications on file  prior to visit.    Past Surgical History  Procedure Laterality Date  . Laparoscopy    . Dental surgery  07/2012    tooth implant    No Known Allergies  History   Social History  . Marital Status: Married    Spouse Name: N/A    Number of Children: N/A  . Years of Education: N/A   Occupational History  . Not on file.   Social History Main Topics  . Smoking status: Former Smoker    Quit date: 03/10/2013  . Smokeless tobacco: Never Used     Comment: 8-9 cigareetes daily  . Alcohol Use: No  . Drug Use: No  . Sexual Activity: Yes    Birth Control/ Protection: Condom   Other Topics Concern  . Not on file   Social History Narrative   Married, 10 years   Has a step son who is 25- she has raised him   Not currently working- wants to find a job   She is working on an Biomedical scientist at [employer]. Husband is Surveyor, mining at [employer]   Has a sister who lives in [place]. Does not have a great relationship with her mom.  Enjoys reading/puzzles.      Family History  Problem Relation Age of Onset  . Anesthesia problems Neg Hx   . Cancer Mother 75    breast  . Osteoporosis Mother   . Thyroid disease Mother     hypothyroidism / grave's disease  . Hyperlipidemia Mother   . Cancer Sister 48    breast  . Stroke Father   . Hypertension Father   . Aneurysm Father     brain  . Hyperlipidemia Father   . Sudden death Father   . Cancer Paternal Aunt     breast  . Cancer Maternal Grandfather     ?bladder  . Cancer Cousin 42    breast  . Cancer Maternal Aunt     ?brain tumor  . Cancer Paternal Aunt     ovarian  . Cancer Cousin     brain tumors  . Alzheimer's disease Paternal Uncle   . Thyroid disease Maternal Grandmother     grave's disease    BP 125/81  Pulse 98  Ht 5\' 4"  (1.626 m)  Wt 136 lb (61.689 kg)  BMI 23.33 kg/m2  LMP 01/02/2014  Review of Systems: See HPI above.    Objective:  Physical Exam:  Gen: NAD  Right ankle/foot: Mod lateral ankle  swelling, bruising at heel.  No other deformity. Mod limitation ROM all directions. TTP greatest over ATFL and lateral malleolus.  No other tenderness. Painful ant drawer and talar tilt.   Positive syndesmotic compression. Thompsons test negative. NV intact distally.    Assessment & Plan:  1. Right ankle injury - consistent with avulsion fracture of your distal fibula.  Icing, nsaids with percocet as needed (gets these from pain clinic).  Elevation for swelling.  Cam walker.  Crutches if needed.  F/u in 2 weeks - hopefully add ROM exercises at that time.

## 2014-01-22 NOTE — Assessment & Plan Note (Signed)
consistent with avulsion fracture of your distal fibula.  Icing, nsaids with percocet as needed (gets these from pain clinic).  Elevation for swelling.  Cam walker.  Crutches if needed.  F/u in 2 weeks - hopefully add ROM exercises at that time.

## 2014-02-05 ENCOUNTER — Ambulatory Visit: Payer: BC Managed Care – PPO | Admitting: Family Medicine

## 2014-02-05 ENCOUNTER — Encounter: Payer: Self-pay | Admitting: Family Medicine

## 2014-02-05 ENCOUNTER — Ambulatory Visit (INDEPENDENT_AMBULATORY_CARE_PROVIDER_SITE_OTHER): Payer: BC Managed Care – PPO | Admitting: Family Medicine

## 2014-02-05 VITALS — BP 119/76 | HR 96 | Ht 64.0 in | Wt 135.0 lb

## 2014-02-05 DIAGNOSIS — S99911D Unspecified injury of right ankle, subsequent encounter: Secondary | ICD-10-CM

## 2014-02-05 DIAGNOSIS — S99919A Unspecified injury of unspecified ankle, initial encounter: Secondary | ICD-10-CM

## 2014-02-05 DIAGNOSIS — S8990XA Unspecified injury of unspecified lower leg, initial encounter: Secondary | ICD-10-CM

## 2014-02-05 DIAGNOSIS — Z5189 Encounter for other specified aftercare: Secondary | ICD-10-CM

## 2014-02-05 DIAGNOSIS — S99929A Unspecified injury of unspecified foot, initial encounter: Secondary | ICD-10-CM

## 2014-02-05 NOTE — Patient Instructions (Signed)
You have an avulsion fracture of your distal fibula. Icing, ibuprofen as needed. Continue your percocet as you have been. Elevate above the level of your heart when possible Walking boot when up and walking around. In 3-4 weeks if feels stable you can switch to a regular shoe. Do up/down and alphabet exercises 3 sets of 10 (alphabet twice) twice a day. Follow up with me in 4 weeks for reevaluation. Hopefully we will add strengthening exercises at that time.

## 2014-02-07 ENCOUNTER — Encounter: Payer: Self-pay | Admitting: Family Medicine

## 2014-02-07 NOTE — Assessment & Plan Note (Signed)
consistent with avulsion fracture of distal fibula.  Clinically improving.  Continue with cam walker - switch to regular shoe in 3-4 weeks.  Start ROM exercises now.  Icing, nsaids with percocet as needed (gets these from pain clinic).  Elevation for swelling.  F/u in 4 weeks - consider physical therapy vs strengthening HEP at that time.

## 2014-02-07 NOTE — Progress Notes (Signed)
Patient ID: Stephanie Montes, female   DOB: 1975/10/21, 38 y.o.   MRN: [id number]  PCP: Nance Pear., NP  Subjective:   HPI: Patient is a 38 y.o. female here for right ankle pain.  8/24: Patient reports on 8/22 she was walking her dog when she turned her ankle in a hole. Couldn't bear weight following this. Placed in a splint and crutches by ED after x-rays showed avulsion fracture of distal fibula. + swelling and bruising. No prior injuries to this ankle.  9/8: Patient reports pain has improved since last visit. Still using boot. Taking percocet, ibuprofen. Still some swelling around ankle also. Able to bear full weight in boot. No longer icing.  Past Medical History  Diagnosis Date  . Ovarian cyst   . Hypertension   . Anxiety     Current Outpatient Prescriptions on File Prior to Visit  Medication Sig Dispense Refill  . ALPRAZolam (XANAX) 0.5 MG tablet TAKE 1 TABLET BY MOUTH THREE TIMES A DAY AS NEEDED  90 tablet  0  . amLODipine (NORVASC) 10 MG tablet Take 1 tablet (10 mg total) by mouth daily.  30 tablet  1  . Cholecalciferol (VITAMIN D PO) Take 1 tablet by mouth daily.      . cloNIDine (CATAPRES) 0.1 MG tablet Take 1 tablet (0.1 mg total) by mouth 2 (two) times daily.  60 tablet  0  . cyanocobalamin 100 MCG tablet Take 100 mcg by mouth 2 (two) times daily.      Marland Kitchen escitalopram (LEXAPRO) 20 MG tablet Take 1 tablet (20 mg total) by mouth daily.  30 tablet  1  . hydrochlorothiazide (HYDRODIURIL) 25 MG tablet Take 1 tablet (25 mg total) by mouth daily.  30 tablet  2  . loratadine (CLARITIN) 10 MG tablet Take 10 mg by mouth daily as needed for allergies.      . metoprolol succinate (TOPROL XL) 100 MG 24 hr tablet Take 1 tablet (100 mg total) by mouth daily. Take with or immediately following a meal.  30 tablet  0  . Multiple Vitamin (MULTIVITAMIN) capsule Take 1 capsule by mouth daily.      Marland Kitchen oxyCODONE-acetaminophen (PERCOCET) 10-325 MG per tablet Take 1 tablet by  mouth 3 (three) times daily as needed.      . venlafaxine (EFFEXOR) 50 MG tablet Take 1 tablet (50 mg total) by mouth 2 (two) times daily.  60 tablet  1   No current facility-administered medications on file prior to visit.    Past Surgical History  Procedure Laterality Date  . Laparoscopy    . Dental surgery  07/2012    tooth implant    No Known Allergies  History   Social History  . Marital Status: Married    Spouse Name: N/A    Number of Children: N/A  . Years of Education: N/A   Occupational History  . Not on file.   Social History Main Topics  . Smoking status: Former Smoker    Quit date: 03/10/2013  . Smokeless tobacco: Never Used     Comment: 8-9 cigareetes daily  . Alcohol Use: No  . Drug Use: No  . Sexual Activity: Yes    Birth Control/ Protection: Condom   Other Topics Concern  . Not on file   Social History Narrative   Married, 10 years   Has a step son who is 44- she has raised him   Not currently working- wants to find a job   She is  working on an Biomedical scientist at [employer]. Husband is Surveyor, mining at [employer]   Has a sister who lives in [place]. Does not have a great relationship with her mom.   Enjoys reading/puzzles.      Family History  Problem Relation Age of Onset  . Anesthesia problems Neg Hx   . Cancer Mother 51    breast  . Osteoporosis Mother   . Thyroid disease Mother     hypothyroidism / grave's disease  . Hyperlipidemia Mother   . Cancer Sister 3    breast  . Stroke Father   . Hypertension Father   . Aneurysm Father     brain  . Hyperlipidemia Father   . Sudden death Father   . Cancer Paternal Aunt     breast  . Cancer Maternal Grandfather     ?bladder  . Cancer Cousin 42    breast  . Cancer Maternal Aunt     ?brain tumor  . Cancer Paternal Aunt     ovarian  . Cancer Cousin     brain tumors  . Alzheimer's disease Paternal Uncle   . Thyroid disease Maternal Grandmother     grave's disease    BP 119/76  Pulse 96   Ht 5\' 4"  (1.626 m)  Wt 135 lb (61.236 kg)  BMI 23.16 kg/m2  LMP 01/02/2014  Review of Systems: See HPI above.    Objective:  Physical Exam:  Gen: NAD  Right ankle/foot: Mild lateral ankle swelling, no bruising.  No other deformity. Minimal limitation ROM all directions. TTP mildly at ATFL, lateral malleolus.  No other tenderness. Painful ant drawer and talar tilt.   Positive syndesmotic compression. Thompsons test negative. NV intact distally.    Assessment & Plan:  1. Right ankle injury - consistent with avulsion fracture of distal fibula.  Clinically improving.  Continue with cam walker - switch to regular shoe in 3-4 weeks.  Start ROM exercises now.  Icing, nsaids with percocet as needed (gets these from pain clinic).  Elevation for swelling.  F/u in 4 weeks - consider physical therapy vs strengthening HEP at that time.

## 2014-02-11 ENCOUNTER — Encounter: Payer: Self-pay | Admitting: *Deleted

## 2014-02-15 ENCOUNTER — Other Ambulatory Visit: Payer: Self-pay | Admitting: Physician Assistant

## 2014-02-15 NOTE — Telephone Encounter (Signed)
A user error has taken place.

## 2014-02-15 NOTE — Telephone Encounter (Signed)
eScribe request from [facility] for refill on Alprazolam Last filled - 08.21.15, #90x0 Last AEX - 08.21.15 Please Advise on refills/SLS

## 2014-02-15 NOTE — Telephone Encounter (Signed)
Rx called to [place] at [facility].

## 2014-02-15 NOTE — Telephone Encounter (Signed)
OK to send 90 tabs with zero refills.

## 2014-02-19 ENCOUNTER — Encounter: Payer: BC Managed Care – PPO | Admitting: Family

## 2014-02-25 ENCOUNTER — Ambulatory Visit: Payer: BC Managed Care – PPO | Admitting: Family

## 2014-02-27 ENCOUNTER — Ambulatory Visit: Payer: BC Managed Care – PPO | Admitting: Family

## 2014-02-27 DIAGNOSIS — Z0289 Encounter for other administrative examinations: Secondary | ICD-10-CM

## 2014-03-05 ENCOUNTER — Ambulatory Visit: Payer: BC Managed Care – PPO | Admitting: Family Medicine

## 2014-03-08 ENCOUNTER — Encounter: Payer: BC Managed Care – PPO | Admitting: Family

## 2014-03-14 ENCOUNTER — Other Ambulatory Visit: Payer: Self-pay | Admitting: Internal Medicine

## 2014-03-14 DIAGNOSIS — R911 Solitary pulmonary nodule: Secondary | ICD-10-CM

## 2014-03-15 ENCOUNTER — Other Ambulatory Visit: Payer: Self-pay | Admitting: Family

## 2014-03-15 ENCOUNTER — Telehealth: Payer: Self-pay | Admitting: Family

## 2014-03-15 NOTE — Telephone Encounter (Signed)
Caller name: Davelyn, Gwinn J Relation to pt: self  Call back number: [phone] Pharmacy: [place], [place]    Reason for call: pt requesting refill ALPRAZolam (XANAX) 0.5 MG tablet & cloNIDine (CATAPRES) 0.1 MG tablet

## 2014-03-16 NOTE — Telephone Encounter (Signed)
OK 

## 2014-03-18 NOTE — Telephone Encounter (Signed)
Caller name:Rayshawn, Visconti J Relation to pt: self  Call back number: [phone] Pharmacy: [place], [place] [phone]    Reason for call:   Requesting refill cloNIDine (CATAPRES) 0.1 MG tablet and ALPRAZolam (XANAX) 0.5 MG tablet. Pt is completely out of clonidine.

## 2014-03-18 NOTE — Telephone Encounter (Signed)
Alprazolam rx faxed at 1:15pm.  Clonidine sent via eRx this a.m.

## 2014-03-18 NOTE — Telephone Encounter (Signed)
Rx faxed to pharmacy  

## 2014-03-18 NOTE — Telephone Encounter (Signed)
Clonidine refill sent to pharmacy. Alprazolam rx printed and forwarded to Provider for signature.

## 2014-03-21 ENCOUNTER — Telehealth: Payer: Self-pay | Admitting: Internal Medicine

## 2014-03-21 ENCOUNTER — Encounter: Payer: Self-pay | Admitting: Family

## 2014-03-21 ENCOUNTER — Encounter: Payer: Self-pay | Admitting: Internal Medicine

## 2014-03-21 ENCOUNTER — Inpatient Hospital Stay: Admission: RE | Admit: 2014-03-21 | Payer: BC Managed Care – PPO | Source: Ambulatory Visit

## 2014-03-21 NOTE — Telephone Encounter (Signed)
Will forward to MW as an FYI 

## 2014-03-21 NOTE — Telephone Encounter (Signed)
Notify referring physician pt was no show -  She is relatively low risk so can reschedule at her volition

## 2014-03-21 NOTE — Telephone Encounter (Signed)
Notes has been routed to PCP. Will sign off

## 2014-03-26 ENCOUNTER — Encounter: Payer: Self-pay | Admitting: Family

## 2014-03-29 ENCOUNTER — Encounter: Payer: Self-pay | Admitting: Family

## 2014-03-29 ENCOUNTER — Ambulatory Visit (INDEPENDENT_AMBULATORY_CARE_PROVIDER_SITE_OTHER): Payer: BC Managed Care – PPO | Admitting: Family

## 2014-03-29 VITALS — BP 138/86 | HR 89 | Temp 98.0°F | Resp 16 | Ht 65.0 in | Wt 141.4 lb

## 2014-03-29 DIAGNOSIS — F32A Depression, unspecified: Secondary | ICD-10-CM

## 2014-03-29 DIAGNOSIS — D229 Melanocytic nevi, unspecified: Secondary | ICD-10-CM

## 2014-03-29 DIAGNOSIS — F329 Major depressive disorder, single episode, unspecified: Secondary | ICD-10-CM

## 2014-03-29 DIAGNOSIS — Z0000 Encounter for general adult medical examination without abnormal findings: Secondary | ICD-10-CM

## 2014-03-29 LAB — BASIC METABOLIC PANEL
BUN: 11 mg/dL (ref 6–23)
CO2: 23 meq/L (ref 19–32)
CREATININE: 0.6 mg/dL (ref 0.4–1.2)
Calcium: 9.2 mg/dL (ref 8.4–10.5)
Chloride: 102 mEq/L (ref 96–112)
GFR: 126.53 mL/min (ref >60.00)
Glucose, Bld: 89 mg/dL (ref 70–99)
Potassium: 3.5 mEq/L (ref 3.5–5.1)
SODIUM: 136 meq/L (ref 135–145)

## 2014-03-29 LAB — URINALYSIS, ROUTINE W REFLEX MICROSCOPIC
Bilirubin Urine: NEGATIVE
Hgb urine dipstick: NEGATIVE
Ketones, ur: NEGATIVE
LEUKOCYTES UA: NEGATIVE
Nitrite: NEGATIVE
SPECIFIC GRAVITY, URINE: 1.02 (ref 1.000–1.030)
Total Protein, Urine: NEGATIVE
UROBILINOGEN UA: 0.2 (ref 0.0–1.0)
Urine Glucose: NEGATIVE
pH: 6 (ref 5.0–8.0)

## 2014-03-29 LAB — CBC WITH DIFFERENTIAL/PLATELET
BASOS PCT: 0.6 % (ref 0.0–3.0)
Basophils Absolute: 0 10*3/uL (ref 0.0–0.1)
Eosinophils Absolute: 0.6 10*3/uL (ref 0.0–0.7)
Eosinophils Relative: 7.4 % — ABNORMAL HIGH (ref 0.0–5.0)
HCT: 37.3 % (ref 36.0–46.0)
HEMOGLOBIN: 11.9 g/dL — AB (ref 12.0–15.0)
Lymphocytes Relative: 35.1 % (ref 12.0–46.0)
Lymphs Abs: 2.7 10*3/uL (ref 0.7–4.0)
MCHC: 32 g/dL (ref 30.0–36.0)
MCV: 91.6 fl (ref 78.0–100.0)
MONO ABS: 0.5 10*3/uL (ref 0.1–1.0)
Monocytes Relative: 6.8 % (ref 3.0–12.0)
NEUTROS ABS: 3.9 10*3/uL (ref 1.4–7.7)
Neutrophils Relative %: 50.1 % (ref 43.0–77.0)
Platelets: 346 10*3/uL (ref 150.0–400.0)
RBC: 4.07 Mil/uL (ref 3.87–5.11)
RDW: 17.8 % — ABNORMAL HIGH (ref 11.5–15.5)
WBC: 7.8 10*3/uL (ref 4.0–10.5)

## 2014-03-29 LAB — LIPID PANEL
CHOLESTEROL: 205 mg/dL — AB (ref 0–200)
HDL: 42.7 mg/dL (ref >39.00)
LDL Cholesterol: 133 mg/dL — ABNORMAL HIGH (ref 0–99)
NONHDL: 162.3
Total CHOL/HDL Ratio: 5
Triglycerides: 146 mg/dL (ref 0.0–149.0)
VLDL: 29.2 mg/dL (ref 0.0–40.0)

## 2014-03-29 LAB — HEPATIC FUNCTION PANEL
ALBUMIN: 3.6 g/dL (ref 3.5–5.2)
ALT: 9 U/L (ref 0–35)
AST: 16 U/L (ref 0–37)
Alkaline Phosphatase: 60 U/L (ref 39–117)
Bilirubin, Direct: 0 mg/dL (ref 0.0–0.3)
TOTAL PROTEIN: 7.1 g/dL (ref 6.0–8.3)
Total Bilirubin: 0.5 mg/dL (ref 0.2–1.2)

## 2014-03-29 LAB — TSH: TSH: 0.67 u[IU]/mL (ref 0.35–4.50)

## 2014-03-29 MED ORDER — ESCITALOPRAM OXALATE 10 MG PO TABS: ORAL_TABLET | ORAL | Status: DC

## 2014-03-29 NOTE — Progress Notes (Signed)
Subjective:    Patient ID: Stephanie Montes, female    DOB: December 12, 1975, 38 y.o.   MRN: [id number]  HPI  Patient presents today for complete physical.  Immunizations: flu shot today Diet: healthy, drinking more water Exercise: has been walking her dog Pap Smear: was referred to [facility],   Mammogram: family history of breast CA Anxiety/depression- using xanax 3+ times a day.  Reports that her depression has worsened. Finds herself not wanting to leave the house.   Feels unmotivated. Denies SI/HI.   Wt Readings from Last 3 Encounters:  03/29/14 141 lb 6.4 oz (64.139 kg)  02/05/14 135 lb (61.236 kg)  01/21/14 136 lb (61.689 kg)     Review of Systems  Constitutional: Negative for unexpected weight change.  HENT: Negative for rhinorrhea.   Respiratory: Negative for cough.   Cardiovascular: Negative for leg swelling.  Gastrointestinal: Negative for diarrhea and constipation.  Genitourinary: Negative for dysuria and menstrual problem.  Musculoskeletal: Positive for back pain.  Skin: Negative for rash.  Neurological:       Occasional HA's    Past Medical History  Diagnosis Date  . Ovarian cyst   . Hypertension   . Anxiety     History   Social History  . Marital Status: Married    Spouse Name: N/A    Number of Children: N/A  . Years of Education: N/A   Occupational History  . Not on file.   Social History Main Topics  . Smoking status: Former Smoker    Quit date: 03/10/2013  . Smokeless tobacco: Never Used     Comment: 8-9 cigareetes daily  . Alcohol Use: No  . Drug Use: No  . Sexual Activity: Yes    Birth Control/ Protection: Condom   Other Topics Concern  . Not on file   Social History Narrative   Married, 10 years   Has a step son who is 41- she has raised him   Not currently working- wants to find a job   She is working on an Biomedical scientist at [employer]. Husband is Surveyor, mining at [employer]   Has a sister who lives in [place]. Does not have a  great relationship with her mom.   Enjoys reading/puzzles.      Past Surgical History  Procedure Laterality Date  . Laparoscopy    . Dental surgery  07/2012    tooth implant    Family History  Problem Relation Age of Onset  . Anesthesia problems Neg Hx   . Cancer Mother 72    breast  . Osteoporosis Mother   . Thyroid disease Mother     hypothyroidism / grave's disease  . Hyperlipidemia Mother   . Cancer Sister 17    breast  . Stroke Father   . Hypertension Father   . Aneurysm Father     brain  . Hyperlipidemia Father   . Sudden death Father   . Cancer Paternal Aunt     breast  . Heart disease Paternal Aunt   . Cancer Maternal Grandfather     ?bladder & ?esophageal cancer  . Cancer Cousin 42    breast  . Cancer Maternal Aunt     ?brain tumor  . Cancer Paternal Aunt     ovarian  . Cancer Cousin     brain tumors  . Alzheimer's disease Paternal Uncle   . Thyroid disease Maternal Grandmother     grave's disease  . Dementia Paternal Aunt  No Known Allergies  Current Outpatient Prescriptions on File Prior to Visit  Medication Sig Dispense Refill  . ALPRAZolam (XANAX) 0.5 MG tablet TAKE 1 TABLET BY MOUTH THREE TIMES A DAY AS NEEDED  90 tablet  0  . amLODipine (NORVASC) 10 MG tablet Take 1 tablet (10 mg total) by mouth daily.  30 tablet  1  . Cholecalciferol (VITAMIN D PO) Take 1 tablet by mouth daily.      . cloNIDine (CATAPRES) 0.1 MG tablet TAKE 1 TABLET BY MOUTH 2 TIMES A DAY  60 tablet  2  . cyanocobalamin 100 MCG tablet Take 100 mcg by mouth 2 (two) times daily.      Marland Kitchen escitalopram (LEXAPRO) 20 MG tablet Take 1 tablet (20 mg total) by mouth daily.  30 tablet  1  . hydrochlorothiazide (HYDRODIURIL) 25 MG tablet Take 1 tablet (25 mg total) by mouth daily.  30 tablet  2  . loratadine (CLARITIN) 10 MG tablet Take 10 mg by mouth daily as needed for allergies.      . metoprolol succinate (TOPROL XL) 100 MG 24 hr tablet Take 1 tablet (100 mg total) by mouth daily.  Take with or immediately following a meal.  30 tablet  0  . Multiple Vitamin (MULTIVITAMIN) capsule Take 1 capsule by mouth daily.      Marland Kitchen oxyCODONE-acetaminophen (PERCOCET) 10-325 MG per tablet Take 1 tablet by mouth 3 (three) times daily as needed.      . venlafaxine (EFFEXOR) 50 MG tablet Take 1 tablet (50 mg total) by mouth 2 (two) times daily.  60 tablet  1   No current facility-administered medications on file prior to visit.    BP 138/86  Pulse 89  Temp(Src) 98 F (36.7 C) (Oral)  Resp 16  Ht 5\' 5"  (1.651 m)  Wt 141 lb 6.4 oz (64.139 kg)  BMI 23.53 kg/m2  SpO2 99%  LMP 02/22/2014       Objective:   Physical Exam  Physical Exam  Constitutional: She is oriented to person, place, and time. She appears well-developed and well-nourished. No distress.  HENT:  Head: Normocephalic and atraumatic.  Right Ear: Tympanic membrane and ear canal normal.  Left Ear: Tympanic membrane and ear canal normal.  Mouth/Throat: Oropharynx is clear and moist.  Eyes: Pupils are equal, round, and reactive to light. No scleral icterus.  Neck: Normal range of motion. No thyromegaly present.  Cardiovascular: Normal rate and regular rhythm.   No murmur heard. Pulmonary/Chest: Effort normal and breath sounds normal. No respiratory distress. He has no wheezes. She has no rales. She exhibits no tenderness.  Abdominal: Soft. Bowel sounds are normal. He exhibits no distension and no mass. There is no tenderness. There is no rebound and no guarding.  Musculoskeletal: She exhibits no edema.  Lymphadenopathy:    She has no cervical adenopathy.  Neurological: She is alert and oriented to person, place, and time. She exhibits normal muscle tone. Coordination normal.  Skin: Skin is warm and dry. multiple nevi noted Psychiatric: She has a normal mood and affect. Her behavior is normal. Judgment and thought content normal.  Breasts: Examined lying Right: Without masses, retractions, discharge or axillary  adenopathy.  Left: Without masses, retractions, discharge or axillary adenopathy.  Pelvic:  deferred          Assessment & Plan:         Assessment & Plan:

## 2014-03-29 NOTE — Assessment & Plan Note (Addendum)
Refer for mammogram, discussed exercise, continue healthy diet.  Would like new GYN.  Will refer. She would like referral to dermatology for skin eval. Will arrange.

## 2014-03-29 NOTE — Patient Instructions (Addendum)
Please complete lab work prior to leaving. Start lexapro 1/2 tab by mouth once daily for 1 week, then increase to a full tab once daily on week two. You will be contacted about your referral to dermatology and GYN.    Follow up in 1 month.

## 2014-03-29 NOTE — Progress Notes (Signed)
Pre visit review using our clinic review tool, if applicable. No additional management support is needed unless otherwise documented below in the visit note. 

## 2014-03-29 NOTE — Assessment & Plan Note (Addendum)
Deteriorated.  Not sure why lexapro was discontinued.  I think she might have just run out of refills and stopped it.  Will continue effexor and add back lexapro 10mg .

## 2014-03-30 ENCOUNTER — Telehealth: Payer: Self-pay | Admitting: Family

## 2014-03-30 DIAGNOSIS — D649 Anemia, unspecified: Secondary | ICD-10-CM

## 2014-03-30 NOTE — Telephone Encounter (Signed)
Please ask pt to add iron 325mg  once daily, dx iron def anemia. And to complete IFOB.

## 2014-04-01 NOTE — Telephone Encounter (Signed)
Pt states that Stephanie Montes found a mass in her breast, per Radiology. Radiology states pt will need diagnostic mammogram and ultrasound, which will have to be done @ the breast center. Please advise.

## 2014-04-01 NOTE — Telephone Encounter (Signed)
Notified pt and she voices understanding. IFOB kit sent to front desk for pick up and future order entered.

## 2014-04-01 NOTE — Telephone Encounter (Signed)
Spoke with pt. She states she showed Provider area of concern at last exam but thinks Provider felt it was normal breast tissue. Pt is concerned as she also notes tenderness of same area and states her sister's breast cancer started this same way. Per verbal from Provider if pt feels something has changed since recent exam she will need re-evaluation in the office. Scheduled pt appt for 04/05/14 at 2pm as she states she cannot come in before the end of the week.

## 2014-04-05 ENCOUNTER — Telehealth: Payer: Self-pay | Admitting: *Deleted

## 2014-04-05 ENCOUNTER — Ambulatory Visit: Payer: BC Managed Care – PPO | Admitting: Family

## 2014-04-05 ENCOUNTER — Encounter: Payer: Self-pay | Admitting: Family

## 2014-04-05 DIAGNOSIS — Z0289 Encounter for other administrative examinations: Secondary | ICD-10-CM

## 2014-04-05 NOTE — Telephone Encounter (Signed)
Pt did not show for appointment 04/05/2014 at 2pm for breast tenderness

## 2014-04-05 NOTE — Telephone Encounter (Signed)
Letter mailed

## 2014-04-05 NOTE — Telephone Encounter (Signed)
Noted, please send no-show letter.

## 2014-04-11 ENCOUNTER — Encounter: Payer: Self-pay | Admitting: Family

## 2014-04-29 ENCOUNTER — Ambulatory Visit: Payer: BC Managed Care – PPO | Admitting: Family

## 2014-05-14 ENCOUNTER — Telehealth: Payer: Self-pay | Admitting: Family

## 2014-05-15 ENCOUNTER — Other Ambulatory Visit: Payer: Self-pay | Admitting: Family

## 2014-05-15 MED ORDER — ALPRAZOLAM 0.5 MG PO TABS: 0.5000 mg | ORAL_TABLET | Freq: Three times a day (TID) | ORAL | Status: DC | PRN

## 2014-05-15 MED ORDER — ESCITALOPRAM OXALATE 10 MG PO TABS: 10.0000 mg | ORAL_TABLET | Freq: Every day | ORAL | Status: DC

## 2014-05-15 NOTE — Telephone Encounter (Signed)
Informed patient of med refill and she already has appointment scheduled

## 2014-05-15 NOTE — Telephone Encounter (Signed)
Pt last seen 03/29/14 and advised 1 month follow up.  30 day supply given of both meds. Alprazolam rx forwarded to Provider for signature. Please call pt to arrange f/u in the next 30 days as further refills cannot be given until she is seen back in the office.

## 2014-05-15 NOTE — Telephone Encounter (Signed)
Rx faxed to pharmacy  

## 2014-05-16 NOTE — Telephone Encounter (Signed)
Med filled yesterday (05/15/14).

## 2014-05-20 ENCOUNTER — Ambulatory Visit (INDEPENDENT_AMBULATORY_CARE_PROVIDER_SITE_OTHER): Payer: BC Managed Care – PPO | Admitting: Family

## 2014-05-20 ENCOUNTER — Encounter: Payer: Self-pay | Admitting: Family

## 2014-05-20 VITALS — BP 154/90 | HR 106 | Temp 98.0°F | Resp 16 | Ht 65.0 in | Wt 137.2 lb

## 2014-05-20 DIAGNOSIS — I10 Essential (primary) hypertension: Secondary | ICD-10-CM

## 2014-05-20 DIAGNOSIS — F32A Depression, unspecified: Secondary | ICD-10-CM

## 2014-05-20 DIAGNOSIS — F329 Major depressive disorder, single episode, unspecified: Secondary | ICD-10-CM

## 2014-05-20 MED ORDER — ESCITALOPRAM OXALATE 10 MG PO TABS: 10.0000 mg | ORAL_TABLET | Freq: Every day | ORAL | Status: DC

## 2014-05-20 NOTE — Assessment & Plan Note (Signed)
BP up today, resume clonidine.

## 2014-05-20 NOTE — Progress Notes (Signed)
Pre visit review using our clinic review tool, if applicable. No additional management support is needed unless otherwise documented below in the visit note. 

## 2014-05-20 NOTE — Assessment & Plan Note (Signed)
Recent increased stress since her husband has left.  Not sleeping well. Will increase lexapro from 10 to 20 and continue effexor. Xanax 1-2 tabs HS PRN. We did discuss referral to a therapist, but she declines at this time due to cost.

## 2014-05-20 NOTE — Patient Instructions (Signed)
Please complete your lab work prior to leaving. Increase lexapro to 20mg  once daily. You may take 1-2 tabs of xanax at bedtime as needed for insomnia.  Follow up in 6 weeks.

## 2014-05-20 NOTE — Progress Notes (Signed)
Subjective:    Patient ID: Stephanie Montes, female    DOB: 10-Aug-1975, 38 y.o.   MRN: [id number]  HPI  Stephanie Montes is a 38 yr old female who presents today to discuss depression.  Depression/Anxiety- she is currently maintained on lexapro 10 mg.  Husband left.  She reports that she continues xanax PRN-  Reports that her last dose was last night at bedtime. During the day she reports that her anxiety is high but is not having as many panic attacks.    HTN- ran out of clonidine yesterday and plans to pick up refill today.   BP Readings from Last 3 Encounters:  05/20/14 154/90  03/29/14 138/86  02/05/14 119/76      Review of Systems See HPI  Past Medical History  Diagnosis Date  . Ovarian cyst   . Hypertension   . Anxiety     History   Social History  . Marital Status: Married    Spouse Name: N/A    Number of Children: N/A  . Years of Education: N/A   Occupational History  . Not on file.   Social History Main Topics  . Smoking status: Former Smoker    Quit date: 03/10/2013  . Smokeless tobacco: Never Used     Comment: 8-9 cigareetes daily  . Alcohol Use: No  . Drug Use: No  . Sexual Activity: Yes    Birth Control/ Protection: Condom   Other Topics Concern  . Not on file   Social History Narrative   Married, 10 years   Has a step son who is 79- she has raised him   Not currently working- wants to find a job   She is working on an Biomedical scientist at [employer]. Husband is Surveyor, mining at [employer]   Has a sister who lives in [place]. Does not have a great relationship with her mom.   Enjoys reading/puzzles.      Past Surgical History  Procedure Laterality Date  . Laparoscopy    . Dental surgery  07/2012    tooth implant    Family History  Problem Relation Age of Onset  . Anesthesia problems Neg Hx   . Cancer Mother 52    breast  . Osteoporosis Mother   . Thyroid disease Mother     hypothyroidism / grave's disease  . Hyperlipidemia Mother   .  Cancer Sister 60    breast  . Stroke Father   . Hypertension Father   . Aneurysm Father     brain  . Hyperlipidemia Father   . Sudden death Father   . Cancer Paternal Aunt     breast  . Heart disease Paternal Aunt   . Cancer Maternal Grandfather     ?bladder & ?esophageal cancer  . Cancer Cousin 42    breast  . Cancer Maternal Aunt     ?brain tumor  . Cancer Paternal Aunt     ovarian  . Cancer Cousin     brain tumors  . Alzheimer's disease Paternal Uncle   . Thyroid disease Maternal Grandmother     grave's disease  . Dementia Paternal Aunt     No Known Allergies  Current Outpatient Prescriptions on File Prior to Visit  Medication Sig Dispense Refill  . ALPRAZolam (XANAX) 0.5 MG tablet Take 1 tablet (0.5 mg total) by mouth 3 (three) times daily as needed. 90 tablet 0  . amLODipine (NORVASC) 10 MG tablet Take 1 tablet (10 mg total) by  mouth daily. 30 tablet 1  . Cholecalciferol (VITAMIN D PO) Take 1 tablet by mouth daily.    . cloNIDine (CATAPRES) 0.1 MG tablet TAKE 1 TABLET BY MOUTH 2 TIMES A DAY 60 tablet 2  . cyanocobalamin 100 MCG tablet Take 100 mcg by mouth 2 (two) times daily.    Marland Kitchen escitalopram (LEXAPRO) 10 MG tablet Take 1 tablet (10 mg total) by mouth daily. 30 tablet 0  . ferrous sulfate 325 (65 FE) MG tablet Take 325 mg by mouth daily with breakfast.    . hydrochlorothiazide (HYDRODIURIL) 25 MG tablet Take 1 tablet (25 mg total) by mouth daily. 30 tablet 2  . metoprolol succinate (TOPROL XL) 100 MG 24 hr tablet Take 1 tablet (100 mg total) by mouth daily. Take with or immediately following a meal. 30 tablet 0  . Multiple Vitamin (MULTIVITAMIN) capsule Take 1 capsule by mouth daily.    Marland Kitchen venlafaxine (EFFEXOR) 50 MG tablet Take 1 tablet (50 mg total) by mouth 2 (two) times daily. 60 tablet 1   No current facility-administered medications on file prior to visit.    BP 154/90 mmHg  Pulse 106  Temp(Src) 98 F (36.7 C) (Oral)  Resp 16  Ht 5\' 5"  (1.651 m)  Wt 137  lb 3.2 oz (62.234 kg)  BMI 22.83 kg/m2  SpO2 99%  LMP 05/06/2014       Objective:   Physical Exam  Constitutional: She is oriented to person, place, and time. She appears well-developed and well-nourished. No distress.  Neurological: She is alert and oriented to person, place, and time.  Psychiatric: She has a normal mood and affect. Her behavior is normal. Judgment and thought content normal.          Assessment & Plan:

## 2014-06-03 ENCOUNTER — Other Ambulatory Visit: Payer: Self-pay | Admitting: Family

## 2014-06-04 NOTE — Telephone Encounter (Signed)
Rx request to pharmacy/SLS  

## 2014-06-12 ENCOUNTER — Other Ambulatory Visit: Payer: Self-pay | Admitting: Family

## 2014-06-12 NOTE — Telephone Encounter (Signed)
Rx  Called to pharmacy voicemail.

## 2014-06-12 NOTE — Telephone Encounter (Signed)
Ok to send 90 tabs zero refills

## 2014-06-25 ENCOUNTER — Telehealth: Payer: Self-pay | Admitting: *Deleted

## 2014-06-25 ENCOUNTER — Encounter: Payer: Self-pay | Admitting: Family

## 2014-06-25 MED ORDER — CLONIDINE HCL 0.1 MG PO TABS: 0.1000 mg | ORAL_TABLET | Freq: Two times a day (BID) | ORAL | Status: DC

## 2014-06-25 NOTE — Telephone Encounter (Signed)
Received fax request from pharmacy for clonidine. Refills sent.

## 2014-06-27 ENCOUNTER — Other Ambulatory Visit: Payer: Self-pay | Admitting: Family

## 2014-06-27 DIAGNOSIS — F419 Anxiety disorder, unspecified: Secondary | ICD-10-CM

## 2014-07-01 ENCOUNTER — Ambulatory Visit: Payer: BC Managed Care – PPO | Admitting: Family

## 2014-07-01 MED ORDER — VENLAFAXINE HCL 50 MG PO TABS: 50.0000 mg | ORAL_TABLET | Freq: Two times a day (BID) | ORAL | Status: DC

## 2014-07-01 NOTE — Telephone Encounter (Signed)
rx sent for 50mg  bid.

## 2014-07-01 NOTE — Telephone Encounter (Signed)
Current request of venlafaxine from pharmacy is for 37.5mg , take 2 tablets daily. Current med list stated 50mg  twice a day. Pt is unable to verify current dose as she states she threw her bottle away and she doesn't remember what strength she was taking. Only that she took 2 a day.  Please advise.

## 2014-07-02 NOTE — Telephone Encounter (Signed)
Notified pt and she voices understanding. 

## 2014-07-09 ENCOUNTER — Other Ambulatory Visit: Payer: Self-pay | Admitting: Family

## 2014-07-10 ENCOUNTER — Telehealth: Payer: Self-pay | Admitting: Family

## 2014-07-10 MED ORDER — ALPRAZOLAM 0.5 MG PO TABS: 0.5000 mg | ORAL_TABLET | Freq: Three times a day (TID) | ORAL | Status: DC | PRN

## 2014-07-10 NOTE — Telephone Encounter (Signed)
Ok to send refill  

## 2014-07-10 NOTE — Telephone Encounter (Signed)
Rx faxed to pharmacy. Pt notified and states she will update controlled substance contract at her appt on 07/17/14.

## 2014-07-10 NOTE — Telephone Encounter (Signed)
Refill sent from previous refill request. Current request is duplicate.

## 2014-07-10 NOTE — Telephone Encounter (Signed)
Alprazolam Rx last given 06/12/14. Pt has f/u 07/17/14. Rx printed and forwarded to Provider for signature. Looks like pt needs updated controlled substance contract as pharmacy has changed.

## 2014-07-13 ENCOUNTER — Other Ambulatory Visit: Payer: Self-pay | Admitting: Family

## 2014-07-17 ENCOUNTER — Ambulatory Visit: Payer: Self-pay | Admitting: Family

## 2014-07-19 ENCOUNTER — Ambulatory Visit: Payer: Self-pay | Admitting: Family

## 2014-07-23 ENCOUNTER — Encounter: Payer: Self-pay | Admitting: Family

## 2014-07-23 ENCOUNTER — Ambulatory Visit: Payer: Self-pay | Admitting: Family

## 2014-07-23 ENCOUNTER — Telehealth: Payer: Self-pay | Admitting: Family

## 2014-07-23 NOTE — Telephone Encounter (Signed)
Pt canceled appointment today due to the fact her work relief called off pt [facility] for 07/24/14

## 2014-07-24 ENCOUNTER — Ambulatory Visit (INDEPENDENT_AMBULATORY_CARE_PROVIDER_SITE_OTHER): Payer: BLUE CROSS/BLUE SHIELD | Admitting: Family

## 2014-07-24 ENCOUNTER — Encounter: Payer: Self-pay | Admitting: Family

## 2014-07-24 VITALS — BP 100/40 | HR 74 | Temp 98.2°F | Resp 16 | Ht 65.0 in | Wt 138.2 lb

## 2014-07-24 DIAGNOSIS — I10 Essential (primary) hypertension: Secondary | ICD-10-CM

## 2014-07-24 DIAGNOSIS — F329 Major depressive disorder, single episode, unspecified: Secondary | ICD-10-CM

## 2014-07-24 DIAGNOSIS — F32A Depression, unspecified: Secondary | ICD-10-CM

## 2014-07-24 DIAGNOSIS — F411 Generalized anxiety disorder: Secondary | ICD-10-CM

## 2014-07-24 NOTE — Assessment & Plan Note (Addendum)
Slightly increased anxiety due to social issues. Will continue on lexapro, effexor, and prn xanax.  We did discuss referral to therapist but she declines.

## 2014-07-24 NOTE — Assessment & Plan Note (Addendum)
BP is a bit low today, just took clonidine prior to coming.  Advised pt: Check blood pressure once daily for then next 3 days and send me your readings in my chart. If BP remains low, consider decreasing bp meds.

## 2014-07-24 NOTE — Progress Notes (Signed)
Subjective:    Patient ID: Stephanie Montes, female    DOB: [birth year], 39 y.o.   MRN: [id number]  HPI  Stephanie Montes is a 39 yr old female who presents today for follow up.  Depression/anxiety- last visit she noted some increased anxiety due to her husband leaving.   She continues lexapro and effexor    Reports depression- good days and bad days.  Continues to have issues with insomnia.  She continues to use xanax as needed.  Her last dose of xanax was early Tuesday AM    HTN- on amlodipine and clonidine and hctz.  She does not have a BP cuff at home.   BP Readings from Last 3 Encounters:  07/24/14 100/40  05/20/14 154/90  03/29/14 138/86      Review of Systems See HPI  Past Medical History  Diagnosis Date  . Ovarian cyst   . Hypertension   . Anxiety     History   Social History  . Marital Status: Married    Spouse Name: N/A  . Number of Children: N/A  . Years of Education: N/A   Occupational History  . Not on file.   Social History Main Topics  . Smoking status: Former Smoker    Quit date: 03/10/2013  . Smokeless tobacco: Never Used     Comment: 8-9 cigareetes daily  . Alcohol Use: No  . Drug Use: No  . Sexual Activity: Yes    Birth Control/ Protection: Condom   Other Topics Concern  . Not on file   Social History Narrative   Married, 10 years   Has a step son who is 69- she has raised him   Not currently working- wants to find a job   She is working on an Biomedical scientist at [employer]. Husband is Surveyor, mining at [employer]   Has a sister who lives in [place]. Does not have a great relationship with her mom.   Enjoys reading/puzzles.      Past Surgical History  Procedure Laterality Date  . Laparoscopy    . Dental surgery  07/2012    tooth implant    Family History  Problem Relation Age of Onset  . Anesthesia problems Neg Hx   . Cancer Mother 82    breast  . Osteoporosis Mother   . Thyroid disease Mother     hypothyroidism / grave's disease  .  Hyperlipidemia Mother   . Cancer Sister 42    breast  . Stroke Father   . Hypertension Father   . Aneurysm Father     brain  . Hyperlipidemia Father   . Sudden death Father   . Cancer Paternal Aunt     breast  . Heart disease Paternal Aunt   . Cancer Maternal Grandfather     ?bladder & ?esophageal cancer  . Cancer Cousin 42    breast  . Cancer Maternal Aunt     ?brain tumor  . Cancer Paternal Aunt     ovarian  . Cancer Cousin     brain tumors  . Alzheimer's disease Paternal Uncle   . Thyroid disease Maternal Grandmother     grave's disease  . Dementia Paternal Aunt     No Known Allergies  Current Outpatient Prescriptions on File Prior to Visit  Medication Sig Dispense Refill  . ALPRAZolam (XANAX) 0.5 MG tablet Take 1 tablet (0.5 mg total) by mouth 3 (three) times daily as needed for anxiety. 90 tablet 0  . amLODipine (  NORVASC) 10 MG tablet Take 1 tablet (10 mg total) by mouth daily. 30 tablet 1  . Cholecalciferol (VITAMIN D PO) Take 1 tablet by mouth daily.    . cloNIDine (CATAPRES) 0.1 MG tablet Take 1 tablet (0.1 mg total) by mouth 2 (two) times daily. 60 tablet 2  . cyanocobalamin 100 MCG tablet Take 100 mcg by mouth 2 (two) times daily.    Marland Kitchen escitalopram (LEXAPRO) 10 MG tablet TAKE 2 TABLETS EVERY DAY 60 tablet 2  . ferrous sulfate 325 (65 FE) MG tablet Take 325 mg by mouth daily with breakfast.    . hydrochlorothiazide (HYDRODIURIL) 25 MG tablet TAKE 1 TABLET (25 MG TOTAL) BY MOUTH DAILY. 30 tablet 2  . metoprolol succinate (TOPROL XL) 100 MG 24 hr tablet Take 1 tablet (100 mg total) by mouth daily. Take with or immediately following a meal. 30 tablet 0  . Multiple Vitamin (MULTIVITAMIN) capsule Take 1 capsule by mouth daily.    Marland Kitchen venlafaxine (EFFEXOR) 50 MG tablet Take 1 tablet (50 mg total) by mouth 2 (two) times daily. 60 tablet 2   No current facility-administered medications on file prior to visit.    BP 100/40 mmHg  Pulse 74  Temp(Src) 98.2 F (36.8 C)  (Oral)  Resp 16  Ht 5\' 5"  (1.651 m)  Wt 138 lb 3.2 oz (62.687 kg)  BMI 23.00 kg/m2  SpO2 99%  LMP 07/10/2014       Objective:   Physical Exam  Constitutional: She appears well-developed and well-nourished.  Cardiovascular: Normal rate, regular rhythm and normal heart sounds.   No murmur heard. Pulmonary/Chest: Effort normal and breath sounds normal. No respiratory distress. She has no wheezes.  Musculoskeletal: She exhibits no edema.  Psychiatric: She has a normal mood and affect. Her behavior is normal. Judgment and thought content normal.          Assessment & Plan:

## 2014-07-24 NOTE — Assessment & Plan Note (Signed)
Stable on lexapro and effexor, continue same.

## 2014-07-24 NOTE — Patient Instructions (Addendum)
Consider scheduling an appointment with a therapist when you are able. Check blood pressure once daily for then next 3 days and send me your readings in my chart. Continue lexapro, effexor and xanax as needed. Follow up in 3 month.

## 2014-07-24 NOTE — Progress Notes (Signed)
Pre visit review using our clinic review tool, if applicable. No additional management support is needed unless otherwise documented below in the visit note. 

## 2014-08-06 ENCOUNTER — Other Ambulatory Visit: Payer: Self-pay | Admitting: Family

## 2014-08-07 ENCOUNTER — Other Ambulatory Visit: Payer: Self-pay | Admitting: Family

## 2014-08-07 NOTE — Telephone Encounter (Signed)
Requesting Alprazolam 0.5mg  -Take 1 tablet by mouth 3 times a day as needed for anxiety. Last refill:07-10-14;#90,0 Last OV:07-24-14 PJS:RPRXYVOP risk-next 08/19/14 Please advise.//AB/CMA

## 2014-08-08 MED ORDER — ALPRAZOLAM 0.5 MG PO TABS: 0.5000 mg | ORAL_TABLET | Freq: Three times a day (TID) | ORAL | Status: DC | PRN

## 2014-08-08 NOTE — Telephone Encounter (Signed)
Ok to send #90.

## 2014-08-08 NOTE — Telephone Encounter (Signed)
Rx printed and placed in Melissa's red folder for review and signature.

## 2014-08-08 NOTE — Telephone Encounter (Signed)
Ok to send 90 zero refill.

## 2014-08-09 NOTE — Telephone Encounter (Signed)
Rx called to pharmacy voicemail. 

## 2014-09-01 ENCOUNTER — Other Ambulatory Visit: Payer: Self-pay | Admitting: Family

## 2014-09-04 ENCOUNTER — Other Ambulatory Visit: Payer: Self-pay | Admitting: Family

## 2014-09-05 NOTE — Telephone Encounter (Signed)
Pt is requesting refill on Alprazolam.  Last OV: 07/24/2014 Last Fill: 08/09/2014 # 90 0RF   Please advise.

## 2014-09-05 NOTE — Telephone Encounter (Signed)
Ok to send 90 tabs, zero ref

## 2014-09-06 NOTE — Telephone Encounter (Signed)
Rx called to pharmacy voicemail. 

## 2014-09-11 ENCOUNTER — Ambulatory Visit: Payer: BLUE CROSS/BLUE SHIELD | Admitting: Physician Assistant

## 2014-09-11 DIAGNOSIS — Z0289 Encounter for other administrative examinations: Secondary | ICD-10-CM

## 2014-09-18 ENCOUNTER — Encounter: Payer: Self-pay | Admitting: Physician Assistant

## 2014-09-18 ENCOUNTER — Telehealth: Payer: Self-pay | Admitting: Family

## 2014-09-18 NOTE — Telephone Encounter (Signed)
Charge. 

## 2014-09-18 NOTE — Telephone Encounter (Signed)
Pt was no show for acute visit on 4/13- letter sent- charge?

## 2014-09-25 ENCOUNTER — Emergency Department (HOSPITAL_BASED_OUTPATIENT_CLINIC_OR_DEPARTMENT_OTHER): Payer: BLUE CROSS/BLUE SHIELD

## 2014-09-25 ENCOUNTER — Other Ambulatory Visit: Payer: Self-pay

## 2014-09-25 ENCOUNTER — Emergency Department (HOSPITAL_BASED_OUTPATIENT_CLINIC_OR_DEPARTMENT_OTHER)
Admission: EM | Admit: 2014-09-25 | Discharge: 2014-09-25 | Disposition: A | Discharge status: Home / Self Care | Payer: BLUE CROSS/BLUE SHIELD | Attending: Emergency Medicine | Admitting: Emergency Medicine

## 2014-09-25 ENCOUNTER — Encounter (HOSPITAL_BASED_OUTPATIENT_CLINIC_OR_DEPARTMENT_OTHER): Payer: Self-pay

## 2014-09-25 DIAGNOSIS — Z720 Tobacco use: Secondary | ICD-10-CM | POA: Insufficient documentation

## 2014-09-25 DIAGNOSIS — Z79899 Other long term (current) drug therapy: Secondary | ICD-10-CM | POA: Diagnosis not present

## 2014-09-25 DIAGNOSIS — R112 Nausea with vomiting, unspecified: Secondary | ICD-10-CM | POA: Diagnosis present

## 2014-09-25 DIAGNOSIS — Z3202 Encounter for pregnancy test, result negative: Secondary | ICD-10-CM | POA: Insufficient documentation

## 2014-09-25 DIAGNOSIS — I10 Essential (primary) hypertension: Secondary | ICD-10-CM | POA: Insufficient documentation

## 2014-09-25 DIAGNOSIS — Z8742 Personal history of other diseases of the female genital tract: Secondary | ICD-10-CM | POA: Insufficient documentation

## 2014-09-25 DIAGNOSIS — R0981 Nasal congestion: Secondary | ICD-10-CM | POA: Insufficient documentation

## 2014-09-25 DIAGNOSIS — R197 Diarrhea, unspecified: Secondary | ICD-10-CM | POA: Insufficient documentation

## 2014-09-25 DIAGNOSIS — F419 Anxiety disorder, unspecified: Secondary | ICD-10-CM | POA: Diagnosis not present

## 2014-09-25 DIAGNOSIS — R0789 Other chest pain: Secondary | ICD-10-CM | POA: Diagnosis not present

## 2014-09-25 DIAGNOSIS — R6883 Chills (without fever): Secondary | ICD-10-CM | POA: Diagnosis not present

## 2014-09-25 LAB — CBC WITH DIFFERENTIAL/PLATELET
Basophils Absolute: 0.1 10*3/uL (ref 0.0–0.1)
Basophils Relative: 0 % (ref 0–1)
EOS ABS: 0 10*3/uL (ref 0.0–0.7)
EOS PCT: 0 % (ref 0–5)
HCT: 39 % (ref 36.0–46.0)
HEMOGLOBIN: 13.2 g/dL (ref 12.0–15.0)
Lymphocytes Relative: 11 % — ABNORMAL LOW (ref 12–46)
Lymphs Abs: 1.4 10*3/uL (ref 0.7–4.0)
MCH: 29.1 pg (ref 26.0–34.0)
MCHC: 33.8 g/dL (ref 30.0–36.0)
MCV: 85.9 fL (ref 78.0–100.0)
MONO ABS: 1 10*3/uL (ref 0.1–1.0)
MONOS PCT: 8 % (ref 3–12)
Neutro Abs: 9.7 10*3/uL — ABNORMAL HIGH (ref 1.7–7.7)
Neutrophils Relative %: 81 % — ABNORMAL HIGH (ref 43–77)
Platelets: 321 10*3/uL (ref 150–400)
RBC: 4.54 MIL/uL (ref 3.87–5.11)
RDW: 15.2 % (ref 11.5–15.5)
WBC: 12.1 10*3/uL — AB (ref 4.0–10.5)

## 2014-09-25 LAB — COMPREHENSIVE METABOLIC PANEL
ALBUMIN: 5 g/dL (ref 3.5–5.2)
ALK PHOS: 90 U/L (ref 39–117)
ALT: 15 U/L (ref 0–35)
ANION GAP: 15 (ref 5–15)
AST: 25 U/L (ref 0–37)
BILIRUBIN TOTAL: 1.2 mg/dL (ref 0.3–1.2)
BUN: 13 mg/dL (ref 6–23)
CHLORIDE: 100 mmol/L (ref 96–112)
CO2: 24 mmol/L (ref 19–32)
Calcium: 9.1 mg/dL (ref 8.4–10.5)
Creatinine, Ser: 0.52 mg/dL (ref 0.50–1.10)
GFR calc Af Amer: >90 mL/min (ref >90)
GFR calc non Af Amer: >90 mL/min (ref >90)
Glucose, Bld: 149 mg/dL — ABNORMAL HIGH (ref 70–99)
POTASSIUM: 3.1 mmol/L — AB (ref 3.5–5.1)
SODIUM: 139 mmol/L (ref 135–145)
TOTAL PROTEIN: 8 g/dL (ref 6.0–8.3)

## 2014-09-25 LAB — LIPASE, BLOOD: Lipase: 35 U/L (ref 11–59)

## 2014-09-25 LAB — PREGNANCY, URINE: PREG TEST UR: NEGATIVE

## 2014-09-25 MED ORDER — POTASSIUM CHLORIDE CRYS ER 20 MEQ PO TBCR
40.0000 meq | EXTENDED_RELEASE_TABLET | Freq: Once | ORAL | Status: AC
  Administered 2014-09-25: 40 meq via ORAL
  Filled 2014-09-25: qty 2

## 2014-09-25 MED ORDER — KETOROLAC TROMETHAMINE 30 MG/ML IJ SOLN
30.0000 mg | Freq: Once | INTRAMUSCULAR | Status: AC
  Administered 2014-09-25: 30 mg via INTRAVENOUS
  Filled 2014-09-25: qty 1

## 2014-09-25 MED ORDER — ONDANSETRON HCL 4 MG/2ML IJ SOLN
4.0000 mg | Freq: Once | INTRAMUSCULAR | Status: AC
  Administered 2014-09-25: 4 mg via INTRAVENOUS
  Filled 2014-09-25: qty 2

## 2014-09-25 MED ORDER — PANTOPRAZOLE SODIUM 40 MG IV SOLR
40.0000 mg | Freq: Once | INTRAVENOUS | Status: AC
  Administered 2014-09-25: 40 mg via INTRAVENOUS
  Filled 2014-09-25: qty 40

## 2014-09-25 MED ORDER — SODIUM CHLORIDE 0.9 % IV BOLUS (SEPSIS)
1000.0000 mL | Freq: Once | INTRAVENOUS | Status: AC
  Administered 2014-09-25: 1000 mL via INTRAVENOUS

## 2014-09-25 MED ORDER — ONDANSETRON 4 MG PO TBDP: ORAL_TABLET | ORAL | Status: DC

## 2014-09-25 NOTE — ED Provider Notes (Signed)
CSN: [id number]     Arrival date & time 09/25/14  0930 History   First MD Initiated Contact with Patient 09/25/14 1000     Chief Complaint  Patient presents with  . Nausea     (Consider location/radiation/quality/duration/timing/severity/associated sxs/prior Treatment) HPI  39 year old female presents with nausea, vomiting, and diarrhea since 2 days ago. Also having generalized myalgias, headache, and chest pain/congestion. Has not been able to keep any fluids down. The diarrhea slowed down but the vomiting has continued and now she is just dry heaving. The patient has felt low-grade fevers as well as chills but has not taken her temperature. There has been no abdominal pain. Patient's mouth feels dry.  Past Medical History  Diagnosis Date  . Ovarian cyst   . Hypertension   . Anxiety    Past Surgical History  Procedure Laterality Date  . Laparoscopy    . Dental surgery  07/2012    tooth implant   Family History  Problem Relation Age of Onset  . Anesthesia problems Neg Hx   . Cancer Mother 72    breast  . Osteoporosis Mother   . Thyroid disease Mother     hypothyroidism / grave's disease  . Hyperlipidemia Mother   . Cancer Sister 44    breast  . Stroke Father   . Hypertension Father   . Aneurysm Father     brain  . Hyperlipidemia Father   . Sudden death Father   . Cancer Paternal Aunt     breast  . Heart disease Paternal Aunt   . Cancer Maternal Grandfather     ?bladder & ?esophageal cancer  . Cancer Cousin 42    breast  . Cancer Maternal Aunt     ?brain tumor  . Cancer Paternal Aunt     ovarian  . Cancer Cousin     brain tumors  . Alzheimer's disease Paternal Uncle   . Thyroid disease Maternal Grandmother     grave's disease  . Dementia Paternal Aunt    History  Substance Use Topics  . Smoking status: Current Every Day Smoker -- 0.50 packs/day    Types: Cigarettes    Last Attempt to Quit: 03/10/2013  . Smokeless tobacco: Never Used     Comment: 8-9  cigareetes daily  . Alcohol Use: No   OB History    Gravida Para Term Preterm AB TAB SAB Ectopic Multiple Living   1 0 0 0 1 0 1 0 0 0      Review of Systems  Constitutional: Positive for chills. Negative for fever.  HENT: Positive for congestion.   Respiratory: Positive for cough.   Cardiovascular: Positive for chest pain.  Gastrointestinal: Positive for nausea, vomiting and diarrhea.  Genitourinary: Positive for decreased urine volume. Negative for dysuria.  Musculoskeletal: Negative for back pain.  All other systems reviewed and are negative.     Allergies  Review of patient's allergies indicates no known allergies.  Home Medications   Prior to Admission medications   Medication Sig Start Date End Date Taking? Authorizing Provider  ALPRAZolam Duanne Moron) 0.5 MG tablet TAKE 1 TABLET 3 TIMES A DAY AS NEEDED FOR ANXIETY 09/06/14   Debbrah Alar, NP  amLODipine (NORVASC) 10 MG tablet Take 1 tablet (10 mg total) by mouth daily. 06/05/13   Debbrah Alar, NP  Cholecalciferol (VITAMIN D PO) Take 1 tablet by mouth daily.    Historical Provider, MD  cloNIDine (CATAPRES) 0.1 MG tablet TAKE 1 TABLET BY MOUTH TWICE  A DAY 09/02/14   Debbrah Alar, NP  cyanocobalamin 100 MCG tablet Take 100 mcg by mouth 2 (two) times daily.    Historical Provider, MD  escitalopram (LEXAPRO) 10 MG tablet TAKE 2 TABLETS EVERY DAY 07/14/14   Debbrah Alar, NP  ferrous sulfate 325 (65 FE) MG tablet Take 325 mg by mouth daily with breakfast.    Historical Provider, MD  hydrochlorothiazide (HYDRODIURIL) 25 MG tablet TAKE 1 TABLET (25 MG TOTAL) BY MOUTH DAILY. 09/02/14   Debbrah Alar, NP  metoprolol succinate (TOPROL XL) 100 MG 24 hr tablet Take 1 tablet (100 mg total) by mouth daily. Take with or immediately following a meal. 10/26/13   Debbrah Alar, NP  Multiple Vitamin (MULTIVITAMIN) capsule Take 1 capsule by mouth daily.    Historical Provider, MD  venlafaxine (EFFEXOR) 50 MG tablet Take 1  tablet (50 mg total) by mouth 2 (two) times daily. 07/01/14   Debbrah Alar, NP   BP 206/101 mmHg  Pulse 108  Temp(Src) 98.3 F (36.8 C) (Oral)  Resp 26  Ht 5\' 4"  (1.626 m)  Wt 130 lb (58.968 kg)  BMI 22.30 kg/m2  SpO2 97%  LMP 09/11/2014 Physical Exam  Constitutional: She is oriented to person, place, and time. She appears well-developed and well-nourished.  HENT:  Head: Normocephalic and atraumatic.  Right Ear: External ear normal.  Left Ear: External ear normal.  Nose: Nose normal.  Mouth/Throat: Mucous membranes are dry. No oropharyngeal exudate.  Eyes: Right eye exhibits no discharge. Left eye exhibits no discharge.  Neck: Neck supple.  Cardiovascular: Normal rate, regular rhythm and normal heart sounds.   Pulmonary/Chest: Effort normal and breath sounds normal. She has no wheezes. She has no rales.  Abdominal: Soft. She exhibits no distension. There is no tenderness.  Neurological: She is alert and oriented to person, place, and time.  Skin: Skin is warm and dry.  Nursing note and vitals reviewed.   ED Course  Procedures (including critical care time) Labs Review Labs Reviewed  COMPREHENSIVE METABOLIC PANEL - Abnormal; Notable for the following:    Potassium 3.1 (*)    Glucose, Bld 149 (*)    All other components within normal limits  CBC WITH DIFFERENTIAL/PLATELET - Abnormal; Notable for the following:    WBC 12.1 (*)    Neutrophils Relative % 81 (*)    Neutro Abs 9.7 (*)    Lymphocytes Relative 11 (*)    All other components within normal limits  LIPASE, BLOOD  PREGNANCY, URINE    Imaging Review Dg Chest 2 View  09/25/2014   CLINICAL DATA:  Nausea, headache, vomiting, chest pain and cough since 09/23/2014.  EXAM: CHEST  2 VIEW  COMPARISON:  CT chest 07/20/2013.  PA and lateral chest 07/06/2013.  FINDINGS: The lungs are clear. Heart size is normal. There is no pneumothorax or pleural effusion. No focal bony abnormality is identified.  IMPRESSION: Negative  chest.   Electronically Signed   By: Inge Rise M.D.   On: 09/25/2014 11:14     EKG Interpretation   Date/Time:  Wednesday September 25 2014 09:53:06 EDT Ventricular Rate:  109 PR Interval:  130 QRS Duration: 82 QT Interval:  348 QTC Calculation: 468 R Axis:   87 Text Interpretation:  Sinus tachycardia Biatrial enlargement Abnormal ECG  No significant change since last tracing Confirmed by Milagro Belmares  MD, Shamarcus Hoheisel  ([id number]) on 09/25/2014 12:43:26 PM      MDM   Final diagnoses:  Nausea vomiting and diarrhea  Patient feels significant improved with IV fluid boluses as well as Zofran. I believe with her myalgias, vomiting, and diarrhea she likely has a viral illness/flu like illness. She does have headache but has no fevers here, no meningismus, and is otherwise well appearing besides feeling dehydrated. Benign abdominal exam. Labs unremarkable besides mild hypokalemia and mild elevated WBC. Able to drink ginger ale after Zofran. I believe she is stable for discharge to follow up with her PCP. Recommended increased oral intake at home, using Zofran as needed, and return precautions.    Sherwood Gambler, MD 09/25/14 [phone]

## 2014-09-25 NOTE — ED Notes (Signed)
Pt report vomiting since Monday. Now complaining of headache, chest pain and n.v but diarrhea has subsided.  Denies abdominal pain.

## 2014-09-25 NOTE — ED Notes (Signed)
Pt reports Monday night starting having n/v/d,  Now reports just throwing up and dry heeving.  Reports headache, cough, and reports burning in her chest.  Pt reports unable to hold anything down.  Reports unable to take BP meds.

## 2014-09-26 ENCOUNTER — Other Ambulatory Visit: Payer: Self-pay | Admitting: Family

## 2014-10-02 ENCOUNTER — Other Ambulatory Visit: Payer: Self-pay | Admitting: Family

## 2014-10-03 NOTE — Telephone Encounter (Signed)
Ok to send 90 tabs zero refills

## 2014-10-04 NOTE — Telephone Encounter (Signed)
Rx called to pharmacy voicemail. 

## 2014-10-07 ENCOUNTER — Encounter: Payer: Self-pay | Admitting: Family

## 2014-10-11 ENCOUNTER — Ambulatory Visit: Payer: Self-pay | Admitting: Family

## 2014-10-14 ENCOUNTER — Ambulatory Visit: Payer: BLUE CROSS/BLUE SHIELD | Admitting: Family

## 2014-10-14 DIAGNOSIS — Z0289 Encounter for other administrative examinations: Secondary | ICD-10-CM

## 2014-10-15 ENCOUNTER — Ambulatory Visit: Payer: Self-pay | Admitting: Family

## 2014-10-20 ENCOUNTER — Other Ambulatory Visit: Payer: Self-pay | Admitting: Family

## 2014-10-22 ENCOUNTER — Encounter: Payer: Self-pay | Admitting: Family

## 2014-10-22 ENCOUNTER — Ambulatory Visit: Payer: BLUE CROSS/BLUE SHIELD | Admitting: Family

## 2014-10-23 ENCOUNTER — Ambulatory Visit: Payer: BLUE CROSS/BLUE SHIELD | Admitting: Family

## 2014-10-29 ENCOUNTER — Ambulatory Visit: Payer: BLUE CROSS/BLUE SHIELD | Admitting: Family

## 2014-10-29 ENCOUNTER — Telehealth: Payer: Self-pay | Admitting: Family

## 2014-10-29 NOTE — Telephone Encounter (Signed)
Patient dismissed from [facility] by Debbrah Alar, NP effective Oct 22, 2014. Dismissal letter sent out by certified / registered mail on Oct 29, 2014. Stephanie Montes

## 2014-10-30 ENCOUNTER — Encounter: Payer: Self-pay | Admitting: Family

## 2014-10-30 ENCOUNTER — Ambulatory Visit (INDEPENDENT_AMBULATORY_CARE_PROVIDER_SITE_OTHER): Payer: BLUE CROSS/BLUE SHIELD | Admitting: Family

## 2014-10-30 VITALS — BP 110/78 | HR 93 | Temp 98.1°F | Resp 14 | Ht 65.0 in | Wt 142.4 lb

## 2014-10-30 DIAGNOSIS — I10 Essential (primary) hypertension: Secondary | ICD-10-CM | POA: Diagnosis not present

## 2014-10-30 DIAGNOSIS — F411 Generalized anxiety disorder: Secondary | ICD-10-CM | POA: Diagnosis not present

## 2014-10-30 MED ORDER — ALPRAZOLAM 0.5 MG PO TABS: ORAL_TABLET | ORAL | Status: AC

## 2014-10-30 NOTE — Patient Instructions (Signed)
Please contact psychiatry to arrange an appointment and keep your upcoming appointment with your therapist.   Psychiatric Services:  [facility], [place] [address], [place], [address], [place], [place] [facility] [phone] [place], [place], [facility], [address] [place], [place]

## 2014-10-30 NOTE — Progress Notes (Signed)
Subjective:    Patient ID: Stephanie Montes, female    DOB: [birth year], 39 y.o.   MRN: [id number]  HPI  Stephanie Montes is a 39 yr old female who presents today to discuss worsening of her anxiety and depression.  She is currently maintained on lexapro 10mg  and effexor 50mg .  Reports that her family has not been supportive. Reports that she is waking up panicking.  Reports that she falls asleep but has trouble staying asleep.    HTN- reports she has not taken amlodipine or metoprolol in a long time.  She continues clonidine and hctz.  BP Readings from Last 3 Encounters:  10/30/14 110/78  09/25/14 185/86  07/24/14 100/40  She denies CP or SOB.    Review of Systems See HPI  Past Medical History  Diagnosis Date  . Ovarian cyst   . Hypertension   . Anxiety     History   Social History  . Marital Status: Married    Spouse Name: N/A  . Number of Children: N/A  . Years of Education: N/A   Occupational History  . Not on file.   Social History Main Topics  . Smoking status: Current Every Day Smoker -- 0.50 packs/day    Types: Cigarettes    Last Attempt to Quit: 03/10/2013  . Smokeless tobacco: Never Used     Comment: 8-9 cigareetes daily  . Alcohol Use: No  . Drug Use: No  . Sexual Activity: Yes    Birth Control/ Protection: Condom   Other Topics Concern  . Not on file   Social History Narrative   Married, 10 years   Has a step son who is 48- she has raised him   Not currently working- wants to find a job   She is working on an Biomedical scientist at [employer]. Husband is Surveyor, mining at [employer]   Has a sister who lives in [place]. Does not have a great relationship with her mom.   Enjoys reading/puzzles.      Past Surgical History  Procedure Laterality Date  . Laparoscopy    . Dental surgery  07/2012    tooth implant    Family History  Problem Relation Age of Onset  . Anesthesia problems Neg Hx   . Cancer Mother 43    breast  . Osteoporosis Mother   .  Thyroid disease Mother     hypothyroidism / grave's disease  . Hyperlipidemia Mother   . Cancer Sister 20    breast  . Stroke Father   . Hypertension Father   . Aneurysm Father     brain  . Hyperlipidemia Father   . Sudden death Father   . Cancer Paternal Aunt     breast  . Heart disease Paternal Aunt   . Cancer Maternal Grandfather     ?bladder & ?esophageal cancer  . Cancer Cousin 42    breast  . Cancer Maternal Aunt     ?brain tumor  . Cancer Paternal Aunt     ovarian  . Cancer Cousin     brain tumors  . Alzheimer's disease Paternal Uncle   . Thyroid disease Maternal Grandmother     grave's disease  . Dementia Paternal Aunt     No Known Allergies  Current Outpatient Prescriptions on File Prior to Visit  Medication Sig Dispense Refill  . ALPRAZolam (XANAX) 0.5 MG tablet TAKE 1 TABLET BY MOUTH 3 TIMES A DAY AS NEEDED FOR ANXIETY 90 tablet 0  .  Cholecalciferol (VITAMIN D PO) Take 1 tablet by mouth daily.    . cloNIDine (CATAPRES) 0.1 MG tablet TAKE 1 TABLET BY MOUTH TWICE A DAY 60 tablet 0  . cyanocobalamin 100 MCG tablet Take 100 mcg by mouth 2 (two) times daily.    Marland Kitchen escitalopram (LEXAPRO) 10 MG tablet TAKE 2 TABLETS EVERY DAY 60 tablet 0  . ferrous sulfate 325 (65 FE) MG tablet Take 325 mg by mouth daily with breakfast.    . hydrochlorothiazide (HYDRODIURIL) 25 MG tablet TAKE 1 TABLET (25 MG TOTAL) BY MOUTH DAILY. 30 tablet 1  . Multiple Vitamin (MULTIVITAMIN) capsule Take 1 capsule by mouth daily.    Marland Kitchen venlafaxine (EFFEXOR) 50 MG tablet TAKE 1 TABLET BY MOUTH TWICE A DAY 60 tablet 0  . amLODipine (NORVASC) 10 MG tablet Take 1 tablet (10 mg total) by mouth daily. (Patient not taking: Reported on 10/30/2014) 30 tablet 1  . metoprolol succinate (TOPROL XL) 100 MG 24 hr tablet Take 1 tablet (100 mg total) by mouth daily. Take with or immediately following a meal. (Patient not taking: Reported on 10/30/2014) 30 tablet 0   No current facility-administered medications on file  prior to visit.    Ht 5\' 5"  (1.651 m)  Wt 142 lb 6.4 oz (64.592 kg)  BMI 23.70 kg/m2       Objective:   Physical Exam  Constitutional: She appears well-developed and well-nourished. No distress.  Psychiatric: Her behavior is normal. Judgment and thought content normal.  Slightly anxious affect          Assessment & Plan:

## 2014-10-30 NOTE — Progress Notes (Signed)
Pre visit review using our clinic review tool, if applicable. No additional management support is needed unless otherwise documented below in the visit note. 

## 2014-10-31 ENCOUNTER — Other Ambulatory Visit: Payer: Self-pay | Admitting: Family

## 2014-10-31 NOTE — Assessment & Plan Note (Addendum)
BP stable on clonidine and hctz. OK to remain off of clonidine and hctz.

## 2014-10-31 NOTE — Assessment & Plan Note (Signed)
Advised pt to keep her upcoming appointment with therapist and to schedule an appointment with psychiatry. Continue xanax, effexor, and lexapro.

## 2014-12-06 ENCOUNTER — Telehealth: Payer: Self-pay | Admitting: *Deleted

## 2014-12-06 NOTE — Telephone Encounter (Signed)
Pt signed ROI received via fax from [facility]. Forwarded to [place] to scan/email to medical records. JG//CMA

## 2015-02-28 ENCOUNTER — Emergency Department (HOSPITAL_COMMUNITY): Payer: No Typology Code available for payment source

## 2015-02-28 ENCOUNTER — Encounter (HOSPITAL_COMMUNITY): Payer: Self-pay | Admitting: *Deleted

## 2015-02-28 ENCOUNTER — Inpatient Hospital Stay (HOSPITAL_COMMUNITY)
Admission: EM | Admit: 2015-02-28 | Discharge: 2015-03-03 | DRG: 563 | Disposition: A | Discharge status: Home / Self Care | Payer: No Typology Code available for payment source | Attending: General Surgery | Admitting: General Surgery

## 2015-02-28 DIAGNOSIS — Y92410 Unspecified street and highway as the place of occurrence of the external cause: Secondary | ICD-10-CM | POA: Diagnosis not present

## 2015-02-28 DIAGNOSIS — T07XXXA Unspecified multiple injuries, initial encounter: Secondary | ICD-10-CM | POA: Diagnosis present

## 2015-02-28 DIAGNOSIS — S81011A Laceration without foreign body, right knee, initial encounter: Secondary | ICD-10-CM | POA: Diagnosis present

## 2015-02-28 DIAGNOSIS — M542 Cervicalgia: Secondary | ICD-10-CM

## 2015-02-28 DIAGNOSIS — S82831A Other fracture of upper and lower end of right fibula, initial encounter for closed fracture: Secondary | ICD-10-CM | POA: Diagnosis present

## 2015-02-28 DIAGNOSIS — F1910 Other psychoactive substance abuse, uncomplicated: Secondary | ICD-10-CM | POA: Diagnosis present

## 2015-02-28 DIAGNOSIS — I10 Essential (primary) hypertension: Secondary | ICD-10-CM | POA: Diagnosis present

## 2015-02-28 DIAGNOSIS — S301XXA Contusion of abdominal wall, initial encounter: Secondary | ICD-10-CM | POA: Diagnosis present

## 2015-02-28 DIAGNOSIS — S7002XA Contusion of left hip, initial encounter: Secondary | ICD-10-CM | POA: Diagnosis present

## 2015-02-28 DIAGNOSIS — T1490XA Injury, unspecified, initial encounter: Secondary | ICD-10-CM

## 2015-02-28 DIAGNOSIS — S7000XA Contusion of unspecified hip, initial encounter: Secondary | ICD-10-CM | POA: Diagnosis present

## 2015-02-28 DIAGNOSIS — Z9114 Patient's other noncompliance with medication regimen: Secondary | ICD-10-CM | POA: Diagnosis not present

## 2015-02-28 DIAGNOSIS — S0990XA Unspecified injury of head, initial encounter: Secondary | ICD-10-CM | POA: Diagnosis present

## 2015-02-28 DIAGNOSIS — S060X9A Concussion with loss of consciousness of unspecified duration, initial encounter: Secondary | ICD-10-CM | POA: Diagnosis present

## 2015-02-28 DIAGNOSIS — S2249XA Multiple fractures of ribs, unspecified side, initial encounter for closed fracture: Secondary | ICD-10-CM

## 2015-02-28 DIAGNOSIS — S060XAA Concussion with loss of consciousness status unknown, initial encounter: Secondary | ICD-10-CM | POA: Diagnosis present

## 2015-02-28 LAB — COMPREHENSIVE METABOLIC PANEL
ALT: 295 U/L — ABNORMAL HIGH (ref 14–54)
AST: 197 U/L — ABNORMAL HIGH (ref 15–41)
Albumin: 3.8 g/dL (ref 3.5–5.0)
Alkaline Phosphatase: 131 U/L — ABNORMAL HIGH (ref 38–126)
Anion gap: 9 (ref 5–15)
BUN: 9 mg/dL (ref 6–20)
CO2: 21 mmol/L — ABNORMAL LOW (ref 22–32)
Calcium: 8.5 mg/dL — ABNORMAL LOW (ref 8.9–10.3)
Chloride: 111 mmol/L (ref 101–111)
Creatinine, Ser: 0.57 mg/dL (ref 0.44–1.00)
GFR calc Af Amer: >60 mL/min (ref >60)
GFR calc non Af Amer: >60 mL/min (ref >60)
Glucose, Bld: 133 mg/dL — ABNORMAL HIGH (ref 65–99)
Potassium: 3.8 mmol/L (ref 3.5–5.1)
Sodium: 141 mmol/L (ref 135–145)
Total Bilirubin: 0.4 mg/dL (ref 0.3–1.2)
Total Protein: 6.7 g/dL (ref 6.5–8.1)

## 2015-02-28 LAB — CDS SEROLOGY

## 2015-02-28 LAB — CBC
HCT: 39.8 % (ref 36.0–46.0)
Hemoglobin: 12.5 g/dL (ref 12.0–15.0)
MCH: 26.2 pg (ref 26.0–34.0)
MCHC: 31.4 g/dL (ref 30.0–36.0)
MCV: 83.4 fL (ref 78.0–100.0)
Platelets: 386 10*3/uL (ref 150–400)
RBC: 4.77 MIL/uL (ref 3.87–5.11)
RDW: 17.1 % — ABNORMAL HIGH (ref 11.5–15.5)
WBC: 15.3 10*3/uL — ABNORMAL HIGH (ref 4.0–10.5)

## 2015-02-28 LAB — RAPID URINE DRUG SCREEN, HOSP PERFORMED
AMPHETAMINES: NOT DETECTED
BARBITURATES: NOT DETECTED
BENZODIAZEPINES: POSITIVE — AB
COCAINE: POSITIVE — AB
Opiates: POSITIVE — AB
Tetrahydrocannabinol: NOT DETECTED

## 2015-02-28 LAB — PROTIME-INR
INR: 1.12 (ref 0.00–1.49)
Prothrombin Time: 14.6 seconds (ref 11.6–15.2)

## 2015-02-28 LAB — URINALYSIS, ROUTINE W REFLEX MICROSCOPIC
BILIRUBIN URINE: NEGATIVE
Glucose, UA: NEGATIVE mg/dL
KETONES UR: NEGATIVE mg/dL
Leukocytes, UA: NEGATIVE
NITRITE: NEGATIVE
Protein, ur: NEGATIVE mg/dL
Specific Gravity, Urine: 1.033 — ABNORMAL HIGH (ref 1.005–1.030)
UROBILINOGEN UA: 1 mg/dL (ref 0.0–1.0)
pH: 6 (ref 5.0–8.0)

## 2015-02-28 LAB — ETHANOL: Alcohol, Ethyl (B): <5 mg/dL (ref <5)

## 2015-02-28 LAB — I-STAT BETA HCG BLOOD, ED (MC, WL, AP ONLY): I-stat hCG, quantitative: <5 m[IU]/mL (ref <5)

## 2015-02-28 LAB — URINE MICROSCOPIC-ADD ON

## 2015-02-28 LAB — LIPASE, BLOOD: LIPASE: 40 U/L (ref 22–51)

## 2015-02-28 LAB — TROPONIN I

## 2015-02-28 LAB — LACTIC ACID, PLASMA: Lactic Acid, Venous: 2.2 mmol/L (ref 0.5–2.0)

## 2015-02-28 LAB — TYPE AND SCREEN
ABO/RH(D): B POS
Antibody Screen: NEGATIVE

## 2015-02-28 MED ORDER — LORAZEPAM 2 MG/ML IJ SOLN
2.0000 mg | Freq: Once | INTRAMUSCULAR | Status: AC
  Administered 2015-02-28: 2 mg via INTRAVENOUS

## 2015-02-28 MED ORDER — TETANUS-DIPHTH-ACELL PERTUSSIS 5-2.5-18.5 LF-MCG/0.5 IM SUSP
0.5000 mL | Freq: Once | INTRAMUSCULAR | Status: AC
  Administered 2015-02-28: 0.5 mL via INTRAMUSCULAR
  Filled 2015-02-28: qty 0.5

## 2015-02-28 MED ORDER — SODIUM CHLORIDE 0.9 % IV BOLUS (SEPSIS)
1000.0000 mL | Freq: Once | INTRAVENOUS | Status: AC
  Administered 2015-02-28: 1000 mL via INTRAVENOUS

## 2015-02-28 MED ORDER — DIPHENHYDRAMINE HCL 50 MG/ML IJ SOLN
25.0000 mg | Freq: Once | INTRAMUSCULAR | Status: AC
  Administered 2015-02-28: 25 mg via INTRAVENOUS

## 2015-02-28 MED ORDER — POTASSIUM CHLORIDE IN NACL 20-0.9 MEQ/L-% IV SOLN
INTRAVENOUS | Status: DC
  Administered 2015-03-01: 02:00:00 via INTRAVENOUS
  Administered 2015-03-01: 125 mL/h via INTRAVENOUS
  Filled 2015-02-28 (×7): qty 1000

## 2015-02-28 MED ORDER — SODIUM CHLORIDE 0.9 % IV SOLN
INTRAVENOUS | Status: AC | PRN
  Administered 2015-02-28: 1000 mL via INTRAVENOUS

## 2015-02-28 MED ORDER — ONDANSETRON HCL 4 MG/2ML IJ SOLN
4.0000 mg | Freq: Once | INTRAMUSCULAR | Status: AC
  Administered 2015-02-28: 4 mg via INTRAVENOUS
  Filled 2015-02-28: qty 2

## 2015-02-28 MED ORDER — FENTANYL CITRATE (PF) 100 MCG/2ML IJ SOLN
50.0000 ug | Freq: Once | INTRAMUSCULAR | Status: AC
  Administered 2015-02-28: 50 ug via INTRAVENOUS

## 2015-02-28 MED ORDER — HALOPERIDOL LACTATE 5 MG/ML IJ SOLN
5.0000 mg | Freq: Once | INTRAMUSCULAR | Status: AC
  Administered 2015-02-28: 5 mg via INTRAMUSCULAR
  Filled 2015-02-28: qty 1

## 2015-02-28 MED ORDER — LIDOCAINE-EPINEPHRINE (PF) 2 %-1:200000 IJ SOLN
10.0000 mL | Freq: Once | INTRAMUSCULAR | Status: DC
  Filled 2015-02-28: qty 20

## 2015-02-28 MED ORDER — LORAZEPAM 2 MG/ML IJ SOLN
INTRAMUSCULAR | Status: AC
  Administered 2015-02-28: 2 mg via INTRAVENOUS
  Filled 2015-02-28: qty 1

## 2015-02-28 MED ORDER — FENTANYL CITRATE (PF) 100 MCG/2ML IJ SOLN
50.0000 ug | Freq: Once | INTRAMUSCULAR | Status: AC
  Administered 2015-02-28: 50 ug via INTRAVENOUS
  Filled 2015-02-28: qty 2

## 2015-02-28 MED ORDER — LORAZEPAM 2 MG/ML IJ SOLN
INTRAMUSCULAR | Status: AC
  Filled 2015-02-28: qty 1

## 2015-02-28 MED ORDER — LIDOCAINE-EPINEPHRINE 1 %-1:100000 IJ SOLN
10.0000 mL | Freq: Once | INTRAMUSCULAR | Status: DC
  Filled 2015-02-28: qty 1

## 2015-02-28 MED ORDER — DIPHENHYDRAMINE HCL 50 MG/ML IJ SOLN
25.0000 mg | Freq: Once | INTRAMUSCULAR | Status: AC
  Administered 2015-02-28: 25 mg via INTRAVENOUS
  Filled 2015-02-28: qty 1

## 2015-02-28 MED ORDER — DIPHENHYDRAMINE HCL 50 MG/ML IJ SOLN
INTRAMUSCULAR | Status: AC
  Administered 2015-02-28: 25 mg via INTRAVENOUS
  Filled 2015-02-28: qty 1

## 2015-02-28 MED ORDER — IOHEXOL 300 MG/ML  SOLN
100.0000 mL | Freq: Once | INTRAMUSCULAR | Status: AC | PRN
  Administered 2015-02-28: 100 mL via INTRAVENOUS
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
## 2015-02-28 NOTE — Progress Notes (Signed)
   02/28/15 1900  Clinical Encounter Type  Visited With Health care provider  Visit Type Psychological support;Spiritual support;Social support  Referral From Nurse;Physician  Consult/Referral To None   Chaplain responded to a page from the ED. Pt.'s family was not with Pt.. See page if family is in need of support.   Dante Gang, Chaplain
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
## 2015-02-28 NOTE — ED Notes (Addendum)
Pt. Yelling out in pain. 36mcg of fent given and 2 mg of ativan given. Pt. Screaming trying to take off equipment. And trying to roll out of stretcher. Pt trying to communicate but words are incomprehensible at this time. RN and EMT at bedside for pt. Safety. MD informed of patient continued state. Will continue to monitor

## 2015-03-01 ENCOUNTER — Inpatient Hospital Stay (HOSPITAL_COMMUNITY): Payer: No Typology Code available for payment source

## 2015-03-01 LAB — MRSA PCR SCREENING: MRSA by PCR: NEGATIVE

## 2015-03-01 LAB — COMPREHENSIVE METABOLIC PANEL
ALBUMIN: 3.6 g/dL (ref 3.5–5.0)
ALT: 255 U/L — ABNORMAL HIGH (ref 14–54)
ANION GAP: 10 (ref 5–15)
AST: 176 U/L — ABNORMAL HIGH (ref 15–41)
Alkaline Phosphatase: 124 U/L (ref 38–126)
BUN: <5 mg/dL — ABNORMAL LOW (ref 6–20)
CALCIUM: 8.3 mg/dL — AB (ref 8.9–10.3)
CO2: 21 mmol/L — AB (ref 22–32)
Chloride: 109 mmol/L (ref 101–111)
Creatinine, Ser: 0.49 mg/dL (ref 0.44–1.00)
GFR calc non Af Amer: >60 mL/min (ref >60)
GLUCOSE: 113 mg/dL — AB (ref 65–99)
POTASSIUM: 4 mmol/L (ref 3.5–5.1)
Sodium: 140 mmol/L (ref 135–145)
TOTAL PROTEIN: 5.8 g/dL — AB (ref 6.5–8.1)
Total Bilirubin: 0.6 mg/dL (ref 0.3–1.2)

## 2015-03-01 LAB — CBC
HCT: 36.5 % (ref 36.0–46.0)
Hemoglobin: 12.2 g/dL (ref 12.0–15.0)
MCH: 27.3 pg (ref 26.0–34.0)
MCHC: 33.4 g/dL (ref 30.0–36.0)
MCV: 81.7 fL (ref 78.0–100.0)
Platelets: 288 10*3/uL (ref 150–400)
RBC: 4.47 MIL/uL (ref 3.87–5.11)
RDW: 17.2 % — AB (ref 11.5–15.5)
WBC: 15.6 10*3/uL — ABNORMAL HIGH (ref 4.0–10.5)

## 2015-03-01 LAB — ABO/RH: ABO/RH(D): B POS

## 2015-03-01 MED ORDER — HYDROCHLOROTHIAZIDE 25 MG PO TABS
25.0000 mg | ORAL_TABLET | Freq: Every day | ORAL | Status: DC
  Administered 2015-03-01 – 2015-03-03 (×3): 25 mg via ORAL
  Filled 2015-03-01 (×3): qty 1

## 2015-03-01 MED ORDER — INFLUENZA VAC SPLIT QUAD 0.5 ML IM SUSY
0.5000 mL | PREFILLED_SYRINGE | INTRAMUSCULAR | Status: AC
  Administered 2015-03-02: 0.5 mL via INTRAMUSCULAR
  Filled 2015-03-01: qty 0.5

## 2015-03-01 MED ORDER — BOOST / RESOURCE BREEZE PO LIQD
1.0000 | Freq: Two times a day (BID) | ORAL | Status: DC
  Administered 2015-03-01 – 2015-03-03 (×6): 1 via ORAL

## 2015-03-01 MED ORDER — ZOLPIDEM TARTRATE 5 MG PO TABS
5.0000 mg | ORAL_TABLET | Freq: Every evening | ORAL | Status: DC | PRN
  Administered 2015-03-02 (×2): 5 mg via ORAL
  Filled 2015-03-01 (×2): qty 1

## 2015-03-01 MED ORDER — MORPHINE SULFATE (PF) 4 MG/ML IV SOLN
4.0000 mg | INTRAVENOUS | Status: DC | PRN
  Administered 2015-03-01 – 2015-03-03 (×4): 4 mg via INTRAVENOUS
  Filled 2015-03-01 (×4): qty 1

## 2015-03-01 MED ORDER — ONDANSETRON HCL 4 MG/2ML IJ SOLN: 4.0000 mg | Freq: Four times a day (QID) | INTRAMUSCULAR | Status: DC | PRN

## 2015-03-01 MED ORDER — LORAZEPAM 2 MG/ML IJ SOLN
1.0000 mg | INTRAMUSCULAR | Status: DC | PRN
  Administered 2015-03-01 – 2015-03-03 (×7): 1 mg via INTRAVENOUS
  Filled 2015-03-01 (×8): qty 1

## 2015-03-01 MED ORDER — KETOROLAC TROMETHAMINE 15 MG/ML IJ SOLN
15.0000 mg | Freq: Four times a day (QID) | INTRAMUSCULAR | Status: AC
  Administered 2015-03-01 – 2015-03-03 (×8): 15 mg via INTRAVENOUS
  Filled 2015-03-01 (×8): qty 1

## 2015-03-01 MED ORDER — HYDROMORPHONE HCL 1 MG/ML IJ SOLN
1.0000 mg | INTRAMUSCULAR | Status: DC | PRN
  Administered 2015-03-01 – 2015-03-02 (×3): 1 mg via INTRAVENOUS
  Filled 2015-03-01 (×3): qty 1

## 2015-03-01 MED ORDER — CLONIDINE HCL 0.1 MG PO TABS
0.1000 mg | ORAL_TABLET | Freq: Two times a day (BID) | ORAL | Status: DC
  Administered 2015-03-01 – 2015-03-03 (×5): 0.1 mg via ORAL
  Filled 2015-03-01 (×5): qty 1

## 2015-03-01 MED ORDER — KETOROLAC TROMETHAMINE 30 MG/ML IJ SOLN
30.0000 mg | Freq: Once | INTRAMUSCULAR | Status: AC
  Administered 2015-03-01: 30 mg via INTRAVENOUS
  Filled 2015-03-01: qty 1

## 2015-03-01 MED ORDER — HYDROMORPHONE HCL 1 MG/ML IJ SOLN: 0.5000 mg | INTRAMUSCULAR | Status: DC | PRN

## 2015-03-01 MED ORDER — PRO-STAT SUGAR FREE PO LIQD
30.0000 mL | Freq: Two times a day (BID) | ORAL | Status: DC
  Administered 2015-03-01 – 2015-03-03 (×3): 30 mL via ORAL
  Filled 2015-03-01 (×3): qty 30

## 2015-03-01 MED ORDER — HYDROMORPHONE HCL 1 MG/ML IJ SOLN
INTRAMUSCULAR | Status: AC
  Administered 2015-03-01: 1 mg
  Filled 2015-03-01: qty 1

## 2015-03-01 MED ORDER — HYDROMORPHONE HCL 1 MG/ML IJ SOLN
1.0000 mg | INTRAMUSCULAR | Status: DC | PRN
  Administered 2015-03-01 – 2015-03-02 (×6): 1 mg via INTRAVENOUS
  Filled 2015-03-01 (×7): qty 1

## 2015-03-01 MED ORDER — ONDANSETRON HCL 4 MG PO TABS: 4.0000 mg | ORAL_TABLET | Freq: Four times a day (QID) | ORAL | Status: DC | PRN
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
## 2015-03-01 NOTE — Progress Notes (Signed)
Orthopedic Tech Progress Note Patient Details:  Stephanie Montes 1975/08/15 [id number]  Ortho Devices Type of Ortho Device: Ace wrap, Post (short leg) splint, Stirrup splint Ortho Device/Splint Location: RLE Ortho Device/Splint Interventions: Application   Asia R Miyasaki 03/01/2015, 1:58 AM

## 2015-03-01 NOTE — ED Notes (Signed)
Right knee sutured by MD

## 2015-03-02 LAB — URINE MICROSCOPIC-ADD ON

## 2015-03-02 LAB — URINALYSIS, ROUTINE W REFLEX MICROSCOPIC
Glucose, UA: NEGATIVE mg/dL
Hgb urine dipstick: NEGATIVE
KETONES UR: 15 mg/dL — AB
NITRITE: NEGATIVE
PH: 6 (ref 5.0–8.0)
Protein, ur: NEGATIVE mg/dL
SPECIFIC GRAVITY, URINE: 1.019 (ref 1.005–1.030)
Urobilinogen, UA: 2 mg/dL — ABNORMAL HIGH (ref 0.0–1.0)

## 2015-03-02 MED ORDER — OXYCODONE HCL 5 MG PO TABS
5.0000 mg | ORAL_TABLET | ORAL | Status: DC | PRN
  Administered 2015-03-02 – 2015-03-03 (×4): 10 mg via ORAL
  Filled 2015-03-02 (×5): qty 2
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
## 2015-03-02 NOTE — Progress Notes (Signed)
Orthopedic Tech Progress Note Patient Details:  Stephanie Montes 09-27-1975 [id number]  Ortho Devices Type of Ortho Device: CAM walker Ortho Device/Splint Location: rle Ortho Device/Splint Interventions: Application   Marquon Alcala 03/02/2015, 12:35 PM
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
## 2015-03-02 NOTE — Progress Notes (Signed)
Pt transferred to unit this morning. Alert, oriented and able to voice needs. C/o pain to shoulder and right lower extremity. Foley d/cd as ordered
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
## 2015-03-02 NOTE — Progress Notes (Signed)
Stephanie Montes  MRN: [id number] DOB/Age: April 18, 1976 39 y.o. Physician: Rada Hay Procedure:       Subjective: Just transferred out of unit to floor, reports right ankle pain and generalized soreness  Vital Signs Temp:  [97.5 F (36.4 C)-99.6 F (37.6 C)] 98.5 F (36.9 C) (10/02 1129) Pulse Rate:  [97-127] 111 (10/02 1129) Resp:  [13-44] 26 (10/02 1129) BP: (125-186)/(71-110) 136/87 mmHg (10/02 1129) SpO2:  [94 %-100 %] 98 % (10/02 1129) Weight:  [61.689 kg (136 lb)] 61.689 kg (136 lb) (10/02 1129)  Lab Results  Recent Labs  02/28/15 1854 03/01/15 0307  WBC 15.3* 15.6*  HGB 12.5 12.2  HCT 39.8 36.5  PLT 386 288   BMET  Recent Labs  02/28/15 1854 03/01/15 0307  NA 141 140  K 3.8 4.0  CL 111 109  CO2 21* 21*  GLUCOSE 133* 113*  BUN 9 <5*  CREATININE 0.57 0.49  CALCIUM 8.5* 8.3*   INR  Date Value Ref Range Status  02/28/2015 1.12 0.00 - 1.49 Final     Exam Right lower extremity in splint, NVI        Plan Reviewed xrays, pt with nondisplaced R distal fibula fracture. Will order CAM walker and can be TDWB RLE  [facility] for Dr.Kevin Supple 03/02/2015, 11:43 AM Contact # [phone]

## 2015-03-02 NOTE — Progress Notes (Signed)
Utilization Review Completed.Twilia Yaklin T10/07/2014  

## 2015-03-03 DIAGNOSIS — S060X9A Concussion with loss of consciousness of unspecified duration, initial encounter: Secondary | ICD-10-CM | POA: Diagnosis present

## 2015-03-03 DIAGNOSIS — S82831A Other fracture of upper and lower end of right fibula, initial encounter for closed fracture: Secondary | ICD-10-CM | POA: Diagnosis present

## 2015-03-03 DIAGNOSIS — S81011A Laceration without foreign body, right knee, initial encounter: Secondary | ICD-10-CM | POA: Diagnosis present

## 2015-03-03 DIAGNOSIS — S7000XA Contusion of unspecified hip, initial encounter: Secondary | ICD-10-CM | POA: Diagnosis present

## 2015-03-03 DIAGNOSIS — F1910 Other psychoactive substance abuse, uncomplicated: Secondary | ICD-10-CM | POA: Diagnosis present

## 2015-03-03 DIAGNOSIS — S060XAA Concussion with loss of consciousness status unknown, initial encounter: Secondary | ICD-10-CM | POA: Diagnosis present

## 2015-03-03 MED ORDER — NAPROXEN 250 MG PO TABS: 500.0000 mg | ORAL_TABLET | Freq: Two times a day (BID) | ORAL | Status: DC

## 2015-03-03 MED ORDER — OXYCODONE-ACETAMINOPHEN 5-325 MG PO TABS: 1.0000 | ORAL_TABLET | ORAL | Status: DC | PRN

## 2015-03-03 MED ORDER — NAPROXEN 500 MG PO TABS: 500.0000 mg | ORAL_TABLET | Freq: Two times a day (BID) | ORAL | Status: DC

## 2015-03-03 MED ORDER — HYDROMORPHONE HCL 1 MG/ML IJ SOLN: 0.5000 mg | INTRAMUSCULAR | Status: DC | PRN
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
## 2015-03-03 NOTE — Progress Notes (Signed)
Pt scheduled for discharge home this pm. Has not utilized lots of pain medication today. Waiting for walker to take home on discharge

## 2015-03-03 NOTE — Progress Notes (Signed)
    Subjective:     Patient reports pain as 2 on 0-10 scale.   Denies CP or SOB.  Voiding without difficulty. Positive flatus. Objective: Vital signs in last 24 hours: Temp:  [97.8 F (36.6 C)-98.3 F (36.8 C)] 98.3 F (36.8 C) (10/03 0656) Pulse Rate:  [93-118] 93 (10/03 0656) Resp:  [18-25] 18 (10/03 0656) BP: (106-143)/(65-88) 128/69 mmHg (10/03 0656) SpO2:  [98 %-100 %] 98 % (10/03 0656)  Intake/Output from previous day: 10/02 0701 - 10/03 0700 In: 680 [P.O.:680] Out: 360 [Urine:360] Intake/Output this shift:    Labs:  Recent Labs  02/28/15 1854 03/01/15 0307  HGB 12.5 12.2    Recent Labs  02/28/15 1854 03/01/15 0307  WBC 15.3* 15.6*  RBC 4.77 4.47  HCT 39.8 36.5  PLT 386 288    Recent Labs  02/28/15 1854 03/01/15 0307  NA 141 140  K 3.8 4.0  CL 111 109  CO2 21* 21*  BUN 9 <5*  CREATININE 0.57 0.49  GLUCOSE 133* 113*  CALCIUM 8.5* 8.3*    Recent Labs  02/28/15 1854  INR 1.12    Physical Exam: Neurologically intact ABD soft Compartment soft  Assessment/Plan:  Patient with stable fibular fracture CAM boot fitting well PWB with crutches/walker F/U in 7-10 days with Dr Delilah Shan Will sign off - please call with questions/concerns.  Melina Schools D for Dr. Melina Schools [facility] [phone] 03/03/2015, 12:17 PM

## 2015-03-03 NOTE — Discharge Instructions (Signed)
Wash wounds daily in shower with soap and water. Do not soak. Apply antibiotic ointment (e.g. Neosporin) twice daily and as needed to keep moist. Cover with dry dressing.  Use CAM walker boot when ambulating

## 2015-03-04 ENCOUNTER — Encounter: Payer: Self-pay | Admitting: Family

## 2015-03-05 ENCOUNTER — Telehealth (HOSPITAL_BASED_OUTPATIENT_CLINIC_OR_DEPARTMENT_OTHER): Payer: Self-pay | Admitting: Emergency Medicine

## 2015-03-25 ENCOUNTER — Observation Stay (HOSPITAL_BASED_OUTPATIENT_CLINIC_OR_DEPARTMENT_OTHER)
Admission: EM | Admit: 2015-03-25 | Discharge: 2015-03-28 | Disposition: A | Discharge status: Home / Self Care | Payer: BLUE CROSS/BLUE SHIELD | Attending: Family Medicine | Admitting: Family Medicine

## 2015-03-25 ENCOUNTER — Encounter (HOSPITAL_BASED_OUTPATIENT_CLINIC_OR_DEPARTMENT_OTHER): Payer: Self-pay | Admitting: Emergency Medicine

## 2015-03-25 DIAGNOSIS — E876 Hypokalemia: Secondary | ICD-10-CM | POA: Diagnosis not present

## 2015-03-25 DIAGNOSIS — Z8781 Personal history of (healed) traumatic fracture: Secondary | ICD-10-CM | POA: Insufficient documentation

## 2015-03-25 DIAGNOSIS — R112 Nausea with vomiting, unspecified: Principal | ICD-10-CM | POA: Insufficient documentation

## 2015-03-25 DIAGNOSIS — R197 Diarrhea, unspecified: Secondary | ICD-10-CM | POA: Diagnosis not present

## 2015-03-25 DIAGNOSIS — F411 Generalized anxiety disorder: Secondary | ICD-10-CM | POA: Diagnosis present

## 2015-03-25 DIAGNOSIS — F17200 Nicotine dependence, unspecified, uncomplicated: Secondary | ICD-10-CM | POA: Diagnosis not present

## 2015-03-25 DIAGNOSIS — F319 Bipolar disorder, unspecified: Secondary | ICD-10-CM | POA: Insufficient documentation

## 2015-03-25 DIAGNOSIS — Z79899 Other long term (current) drug therapy: Secondary | ICD-10-CM | POA: Diagnosis not present

## 2015-03-25 DIAGNOSIS — F1910 Other psychoactive substance abuse, uncomplicated: Secondary | ICD-10-CM | POA: Diagnosis not present

## 2015-03-25 DIAGNOSIS — R1110 Vomiting, unspecified: Secondary | ICD-10-CM | POA: Diagnosis not present

## 2015-03-25 DIAGNOSIS — R740 Nonspecific elevation of levels of transaminase and lactic acid dehydrogenase [LDH]: Secondary | ICD-10-CM | POA: Insufficient documentation

## 2015-03-25 DIAGNOSIS — G4700 Insomnia, unspecified: Secondary | ICD-10-CM | POA: Diagnosis not present

## 2015-03-25 DIAGNOSIS — R7401 Elevation of levels of liver transaminase levels: Secondary | ICD-10-CM | POA: Diagnosis present

## 2015-03-25 DIAGNOSIS — F419 Anxiety disorder, unspecified: Secondary | ICD-10-CM | POA: Diagnosis not present

## 2015-03-25 DIAGNOSIS — I10 Essential (primary) hypertension: Secondary | ICD-10-CM | POA: Diagnosis not present

## 2015-03-25 HISTORY — DX: Concussion with loss of consciousness status unknown, initial encounter: S06.0XAA

## 2015-03-25 HISTORY — DX: Unspecified fracture of shaft of unspecified fibula, initial encounter for closed fracture: S82.409A

## 2015-03-25 HISTORY — DX: Other psychoactive substance abuse, uncomplicated: F19.10

## 2015-03-25 HISTORY — DX: Tobacco use: Z72.0

## 2015-03-25 HISTORY — DX: Elevation of levels of liver transaminase levels: R74.01

## 2015-03-25 HISTORY — DX: Nonspecific elevation of levels of transaminase and lactic acid dehydrogenase (ldh): R74.0

## 2015-03-25 HISTORY — DX: Concussion with loss of consciousness of unspecified duration, initial encounter: S06.0X9A

## 2015-03-25 LAB — COMPREHENSIVE METABOLIC PANEL
ALK PHOS: 190 U/L — AB (ref 38–126)
ALT: 88 U/L — ABNORMAL HIGH (ref 14–54)
AST: 91 U/L — AB (ref 15–41)
Albumin: 3.6 g/dL (ref 3.5–5.0)
Anion gap: 10 (ref 5–15)
BILIRUBIN TOTAL: 1 mg/dL (ref 0.3–1.2)
BUN: 7 mg/dL (ref 6–20)
CO2: 25 mmol/L (ref 22–32)
Calcium: 8.5 mg/dL — ABNORMAL LOW (ref 8.9–10.3)
Chloride: 104 mmol/L (ref 101–111)
Creatinine, Ser: 0.32 mg/dL — ABNORMAL LOW (ref 0.44–1.00)
GFR calc Af Amer: >60 mL/min (ref >60)
GFR calc non Af Amer: >60 mL/min (ref >60)
GLUCOSE: 108 mg/dL — AB (ref 65–99)
Potassium: 2.8 mmol/L — ABNORMAL LOW (ref 3.5–5.1)
Sodium: 139 mmol/L (ref 135–145)
Total Protein: 6.5 g/dL (ref 6.5–8.1)

## 2015-03-25 LAB — MAGNESIUM: MAGNESIUM: 1.3 mg/dL — AB (ref 1.7–2.4)

## 2015-03-25 LAB — CREATININE, SERUM
CREATININE: 0.46 mg/dL (ref 0.44–1.00)
GFR calc Af Amer: >60 mL/min (ref >60)
GFR calc non Af Amer: >60 mL/min (ref >60)

## 2015-03-25 LAB — CBC
HCT: 36.9 % (ref 36.0–46.0)
HEMATOCRIT: 38.2 % (ref 36.0–46.0)
HEMOGLOBIN: 12.5 g/dL (ref 12.0–15.0)
Hemoglobin: 12 g/dL (ref 12.0–15.0)
MCH: 26.4 pg (ref 26.0–34.0)
MCH: 26.4 pg (ref 26.0–34.0)
MCHC: 32.7 g/dL (ref 30.0–36.0)
MCHC: 32.5 g/dL (ref 30.0–36.0)
MCV: 80.6 fL (ref 78.0–100.0)
MCV: 81.1 fL (ref 78.0–100.0)
Platelets: 185 10*3/uL (ref 150–400)
Platelets: 200 10*3/uL (ref 150–400)
RBC: 4.74 MIL/uL (ref 3.87–5.11)
RBC: 4.55 MIL/uL (ref 3.87–5.11)
RDW: 18.2 % — ABNORMAL HIGH (ref 11.5–15.5)
RDW: 17.9 % — ABNORMAL HIGH (ref 11.5–15.5)
WBC: 8.7 10*3/uL (ref 4.0–10.5)
WBC: 7.8 10*3/uL (ref 4.0–10.5)

## 2015-03-25 LAB — URINALYSIS, ROUTINE W REFLEX MICROSCOPIC
BILIRUBIN URINE: NEGATIVE
GLUCOSE, UA: NEGATIVE mg/dL
HGB URINE DIPSTICK: NEGATIVE
LEUKOCYTES UA: NEGATIVE
Nitrite: NEGATIVE
PH: 7 (ref 5.0–8.0)
Protein, ur: NEGATIVE mg/dL
Specific Gravity, Urine: 1.012 (ref 1.005–1.030)
Urobilinogen, UA: 1 mg/dL (ref 0.0–1.0)

## 2015-03-25 LAB — RAPID URINE DRUG SCREEN, HOSP PERFORMED
Amphetamines: NOT DETECTED
BARBITURATES: NOT DETECTED
Benzodiazepines: NOT DETECTED
COCAINE: NOT DETECTED
Opiates: POSITIVE — AB
Tetrahydrocannabinol: NOT DETECTED

## 2015-03-25 LAB — LIPASE, BLOOD: LIPASE: 46 U/L (ref 11–51)

## 2015-03-25 LAB — TSH: TSH: 0.178 u[IU]/mL — AB (ref 0.350–4.500)

## 2015-03-25 LAB — I-STAT CG4 LACTIC ACID, ED: LACTIC ACID, VENOUS: 1.1 mmol/L (ref 0.5–2.0)

## 2015-03-25 MED ORDER — PROMETHAZINE HCL 25 MG/ML IJ SOLN
25.0000 mg | Freq: Once | INTRAMUSCULAR | Status: AC
  Administered 2015-03-25: 25 mg via INTRAVENOUS
  Filled 2015-03-25: qty 1

## 2015-03-25 MED ORDER — ONDANSETRON HCL 4 MG/2ML IJ SOLN
INTRAMUSCULAR | Status: AC
  Administered 2015-03-25: 4 mg
  Filled 2015-03-25: qty 2

## 2015-03-25 MED ORDER — POTASSIUM CHLORIDE 10 MEQ/100ML IV SOLN
10.0000 meq | INTRAVENOUS | Status: AC
  Administered 2015-03-25 (×2): 10 meq via INTRAVENOUS
  Filled 2015-03-25 (×2): qty 100

## 2015-03-25 MED ORDER — SODIUM CHLORIDE 0.9 % IV BOLUS (SEPSIS)
1000.0000 mL | Freq: Once | INTRAVENOUS | Status: AC
Start: 2015-03-25 — End: 2015-03-25
  Administered 2015-03-25: 1000 mL via INTRAVENOUS

## 2015-03-25 MED ORDER — PROMETHAZINE HCL 25 MG/ML IJ SOLN
INTRAMUSCULAR | Status: AC
  Administered 2015-03-25: 25 mg
  Filled 2015-03-25: qty 1

## 2015-03-25 MED ORDER — CLONIDINE HCL 0.1 MG PO TABS
0.1000 mg | ORAL_TABLET | Freq: Two times a day (BID) | ORAL | Status: DC
  Administered 2015-03-25 – 2015-03-28 (×6): 0.1 mg via ORAL
  Filled 2015-03-25 (×6): qty 1

## 2015-03-25 MED ORDER — ALPRAZOLAM 0.5 MG PO TABS
0.5000 mg | ORAL_TABLET | Freq: Three times a day (TID) | ORAL | Status: DC | PRN
  Administered 2015-03-25 – 2015-03-28 (×5): 0.5 mg via ORAL
  Filled 2015-03-25 (×5): qty 1

## 2015-03-25 MED ORDER — POTASSIUM CHLORIDE CRYS ER 20 MEQ PO TBCR
40.0000 meq | EXTENDED_RELEASE_TABLET | Freq: Once | ORAL | Status: DC
  Filled 2015-03-25: qty 2

## 2015-03-25 MED ORDER — ENOXAPARIN SODIUM 30 MG/0.3ML ~~LOC~~ SOLN
30.0000 mg | SUBCUTANEOUS | Status: DC
  Administered 2015-03-25 – 2015-03-27 (×3): 30 mg via SUBCUTANEOUS
  Filled 2015-03-25 (×3): qty 0.3

## 2015-03-25 MED ORDER — SODIUM CHLORIDE 0.9 % IV SOLN
Freq: Once | INTRAVENOUS | Status: AC
  Administered 2015-03-25: 1000 mL via INTRAVENOUS

## 2015-03-25 MED ORDER — ZOLPIDEM TARTRATE 5 MG PO TABS
5.0000 mg | ORAL_TABLET | Freq: Every evening | ORAL | Status: DC | PRN
  Administered 2015-03-26 – 2015-03-27 (×2): 5 mg via ORAL
  Filled 2015-03-25 (×3): qty 1

## 2015-03-25 MED ORDER — ACETAMINOPHEN 325 MG PO TABS: 650.0000 mg | ORAL_TABLET | Freq: Four times a day (QID) | ORAL | Status: DC | PRN

## 2015-03-25 MED ORDER — PROMETHAZINE HCL 25 MG/ML IJ SOLN
6.2500 mg | INTRAMUSCULAR | Status: DC | PRN
Start: 2015-03-25 — End: 2015-03-28
  Administered 2015-03-25 – 2015-03-28 (×11): 6.25 mg via INTRAVENOUS
  Filled 2015-03-25 (×11): qty 1

## 2015-03-25 MED ORDER — ACETAMINOPHEN 650 MG RE SUPP: 650.0000 mg | Freq: Four times a day (QID) | RECTAL | Status: DC | PRN

## 2015-03-25 MED ORDER — LORAZEPAM 2 MG/ML IJ SOLN
1.0000 mg | Freq: Once | INTRAMUSCULAR | Status: AC
  Administered 2015-03-25: 1 mg via INTRAVENOUS
  Filled 2015-03-25: qty 1

## 2015-03-25 MED ORDER — HYDRALAZINE HCL 20 MG/ML IJ SOLN
5.0000 mg | INTRAMUSCULAR | Status: DC | PRN
  Administered 2015-03-25 – 2015-03-27 (×3): 5 mg via INTRAVENOUS
  Filled 2015-03-25 (×3): qty 1

## 2015-03-25 MED ORDER — ONDANSETRON HCL 4 MG PO TABS
4.0000 mg | ORAL_TABLET | ORAL | Status: DC
  Administered 2015-03-26 – 2015-03-27 (×4): 4 mg via ORAL
  Filled 2015-03-25 (×4): qty 1

## 2015-03-25 MED ORDER — POTASSIUM CHLORIDE IN NACL 20-0.9 MEQ/L-% IV SOLN
INTRAVENOUS | Status: DC
  Administered 2015-03-25 – 2015-03-27 (×5): via INTRAVENOUS
  Filled 2015-03-25 (×8): qty 1000

## 2015-03-25 MED ORDER — HYDROMORPHONE HCL 1 MG/ML IJ SOLN
0.5000 mg | INTRAMUSCULAR | Status: DC | PRN
  Administered 2015-03-25 – 2015-03-28 (×17): 0.5 mg via INTRAVENOUS
  Filled 2015-03-25 (×19): qty 1

## 2015-03-25 MED ORDER — BOOST / RESOURCE BREEZE PO LIQD
1.0000 | Freq: Three times a day (TID) | ORAL | Status: DC
  Administered 2015-03-25 – 2015-03-28 (×8): 1 via ORAL

## 2015-03-25 MED ORDER — ONDANSETRON HCL 4 MG/2ML IJ SOLN
4.0000 mg | INTRAMUSCULAR | Status: DC
Start: 2015-03-25 — End: 2015-03-28
  Administered 2015-03-25 – 2015-03-27 (×6): 4 mg via INTRAVENOUS
  Filled 2015-03-25 (×8): qty 2

## 2015-03-25 NOTE — ED Notes (Signed)
Nausea,vomiting,diarrhea x 1 week

## 2015-03-25 NOTE — ED Notes (Signed)
[facility] is transporting patient to 5W32 at [facility]
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
## 2015-03-25 NOTE — ED Notes (Signed)
Report given to Jennifer<RN  On 5000 at [facility]

## 2015-03-25 NOTE — ED Notes (Signed)
repaged hospitalist for doctor
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
## 2015-03-25 NOTE — Progress Notes (Signed)
NURSING PROGRESS NOTE  Stephanie Montes [id number] Admission Data: 03/25/2015 2:46 PM Attending Provider: No att. providers found PCP:No primary care provider on file. Code Status: Full  Stephanie Montes is a 39 y.o. female patient admitted from ED:  -No acute distress noted.  -No complaints of shortness of breath.  -No complaints of chest pain.   Cardiac Monitoring: Box # 2 in place. Cardiac monitor yields:normal sinus rhythm.  Blood pressure 179/89, pulse 84, temperature 98 F (36.7 C), temperature source Oral, resp. rate 18, SpO2 100 %.   IV Fluids:  IV in place, occlusive dsg intact without redness, IV cath antecubital right, condition patent and no redness none.   Allergies:  Review of patient's allergies indicates no known allergies.  Past Medical History:   has a past medical history of Ovarian cyst; Anxiety; and Hypertension.  Past Surgical History:   has past surgical history that includes laparoscopy and Dental surgery (07/2012).  Social History:   reports that she has never smoked. She has never used smokeless tobacco. She reports that she does not drink alcohol or use illicit drugs.  Skin: Intact  Patient/Family orientated to room. Information packet given to patient/family. Admission inpatient armband information verified with patient/family to include name and date of birth and placed on patient arm. Side rails up x 2, fall assessment and education completed with patient/family. Patient/family able to verbalize understanding of risk associated with falls and verbalized understanding to call for assistance before getting out of bed. Call light within reach. Patient/family able to voice and demonstrate understanding of unit orientation instructions.    Will continue to evaluate and treat per MD orders.

## 2015-03-25 NOTE — ED Notes (Signed)
Report given to Josh,RN with [facility]

## 2015-03-25 NOTE — ED Provider Notes (Signed)
CSN: [id number]     Arrival date & time 03/25/15  7673 History   First MD Initiated Contact with Patient 03/25/15 0750     Chief Complaint  Patient presents with  . Diarrhea     (Consider location/radiation/quality/duration/timing/severity/associated sxs/prior Treatment) HPI Comments: Patient has had vomiting and diarrhea for the past week. She has had chills. She went to an Urgent Care where she received fluids and anti-emetics that didn't help.  Denies any urinary or vaginal symptoms. She reports some streaky blood in her emesis, but no frank hematemesis. No hematochezia. No recent travel or antibiotics.  Patient is a 39 y.o. female presenting with diarrhea. The history is provided by the patient.  Diarrhea Quality:  Watery Severity:  Severe Onset quality:  Gradual Duration:  1 week Timing:  Constant Progression:  Unchanged Relieved by:  Nothing Worsened by:  Nothing tried Associated symptoms: abdominal pain and vomiting   Associated symptoms: no chills and no fever   Vomiting:    Quality:  Stomach contents   Severity:  Severe   Duration:  1 week   Timing:  Constant   Past Medical History  Diagnosis Date  . Ovarian cyst   . Anxiety   . Hypertension    Past Surgical History  Procedure Laterality Date  . Laparoscopy    . Dental surgery  07/2012    tooth implant   Family History  Problem Relation Age of Onset  . Anesthesia problems Neg Hx   . Cancer Mother 72    breast  . Osteoporosis Mother   . Thyroid disease Mother     hypothyroidism / grave's disease  . Hyperlipidemia Mother   . Cancer Sister 44    breast  . Stroke Father   . Hypertension Father   . Aneurysm Father     brain  . Hyperlipidemia Father   . Sudden death Father   . Cancer Paternal Aunt     breast  . Heart disease Paternal Aunt   . Cancer Maternal Grandfather     ?bladder & ?esophageal cancer  . Cancer Cousin 42    breast  . Cancer Maternal Aunt     ?brain tumor  . Cancer Paternal  Aunt     ovarian  . Cancer Cousin     brain tumors  . Alzheimer's disease Paternal Uncle   . Thyroid disease Maternal Grandmother     grave's disease  . Dementia Paternal Aunt    Social History  Substance Use Topics  . Smoking status: Never Smoker   . Smokeless tobacco: Never Used  . Alcohol Use: No   OB History    Gravida Para Term Preterm AB TAB SAB Ectopic Multiple Living   1 0 0 0 1 0 1 0       Review of Systems  Constitutional: Negative for fever and chills.  Respiratory: Negative for cough and shortness of breath.   Cardiovascular: Negative for chest pain.  Gastrointestinal: Positive for nausea, vomiting, abdominal pain and diarrhea. Negative for blood in stool.  All other systems reviewed and are negative.     Allergies  Review of patient's allergies indicates no known allergies.  Home Medications   Prior to Admission medications   Medication Sig Start Date End Date Taking? Authorizing Provider  ALPRAZolam (XANAX) 0.5 MG tablet TAKE 1 TABLET BY MOUTH 3 TIMES A DAY AS NEEDED FOR ANXIETY 10/30/14   Debbrah Alar, NP  Cholecalciferol (VITAMIN D PO) Take 1 tablet by mouth daily.  Historical Provider, MD  cloNIDine (CATAPRES) 0.1 MG tablet TAKE 1 TABLET BY MOUTH TWICE A DAY 10/21/14   Debbrah Alar, NP  cyanocobalamin 100 MCG tablet Take 100 mcg by mouth 2 (two) times daily.    Historical Provider, MD  escitalopram (LEXAPRO) 10 MG tablet TAKE 2 TABLETS EVERY DAY 09/27/14   Debbrah Alar, NP  ferrous sulfate 325 (65 FE) MG tablet Take 325 mg by mouth daily with breakfast.    Historical Provider, MD  hydrochlorothiazide (HYDRODIURIL) 25 MG tablet TAKE 1 TABLET (25 MG TOTAL) BY MOUTH DAILY. 10/31/14   Debbrah Alar, NP  Multiple Vitamin (MULTIVITAMIN) capsule Take 1 capsule by mouth daily.    Historical Provider, MD  naproxen (NAPROSYN) 500 MG tablet Take 1 tablet (500 mg total) by mouth 2 (two) times daily with a meal. 03/03/15   Lisette Abu, PA-C   oxyCODONE-acetaminophen (ROXICET) 5-325 MG tablet Take 1-2 tablets by mouth every 4 (four) hours as needed (Pain). 03/03/15   Lisette Abu, PA-C  venlafaxine (EFFEXOR) 50 MG tablet TAKE 1 TABLET BY MOUTH TWICE A DAY 10/21/14   Debbrah Alar, NP   BP 157/82 mmHg  Pulse 82  Temp(Src) 98.4 F (36.9 C) (Oral)  Resp 18  SpO2 100% Physical Exam  Constitutional: She is oriented to person, place, and time. She appears well-developed and well-nourished. No distress.  HENT:  Head: Normocephalic and atraumatic.  Mouth/Throat: Oropharynx is clear and moist.  Eyes: EOM are normal. Pupils are equal, round, and reactive to light.  Neck: Normal range of motion. Neck supple.  Cardiovascular: Normal rate and regular rhythm.  Exam reveals no friction rub.   No murmur heard. Pulmonary/Chest: Effort normal and breath sounds normal. No respiratory distress. She has no wheezes. She has no rales.  Abdominal: Soft. She exhibits no distension. There is tenderness (mild, periumbilical). There is no rebound.  Musculoskeletal: Normal range of motion. She exhibits no edema.  Neurological: She is alert and oriented to person, place, and time.  Skin: She is not diaphoretic.  Nursing note and vitals reviewed.   ED Course  Procedures (including critical care time) Labs Review Labs Reviewed  CBC - Abnormal; Notable for the following:    RDW 18.2 (*)    All other components within normal limits  COMPREHENSIVE METABOLIC PANEL - Abnormal; Notable for the following:    Potassium 2.8 (*)    Glucose, Bld 108 (*)    Creatinine, Ser 0.32 (*)    Calcium 8.5 (*)    AST 91 (*)    ALT 88 (*)    Alkaline Phosphatase 190 (*)    All other components within normal limits  URINALYSIS, ROUTINE W REFLEX MICROSCOPIC (NOT AT [facility]) - Abnormal; Notable for the following:    APPearance CLOUDY (*)    Ketones, ur >80 (*)    All other components within normal limits  URINE RAPID DRUG SCREEN, HOSP PERFORMED - Abnormal;  Notable for the following:    Opiates POSITIVE (*)    All other components within normal limits  LIPASE, BLOOD  I-STAT CG4 LACTIC ACID, ED    Imaging Review No results found. I have personally reviewed and evaluated these images and lab results as part of my medical decision-making.   EKG Interpretation None      MDM   Final diagnoses:  Intractable vomiting with nausea, vomiting of unspecified type    72F here with nausea, vomiting, diarrhea for one week. Reports no relief with anti-emetics she received from  an Urgent Care. No urinary or vaginal symptoms. On exam, very mild central abdominal pain, no guarding or rebound. No masses. Will start with labs and fluids.  Labs show some ketosis in her urine. Otherwise they're normal. We're unable to control her vomiting. Will admit.  Evelina Bucy, MD 03/25/15 1245

## 2015-03-26 DIAGNOSIS — R197 Diarrhea, unspecified: Secondary | ICD-10-CM | POA: Diagnosis not present

## 2015-03-26 DIAGNOSIS — R1110 Vomiting, unspecified: Secondary | ICD-10-CM | POA: Diagnosis not present

## 2015-03-26 DIAGNOSIS — E876 Hypokalemia: Secondary | ICD-10-CM | POA: Diagnosis not present

## 2015-03-26 DIAGNOSIS — F411 Generalized anxiety disorder: Secondary | ICD-10-CM

## 2015-03-26 DIAGNOSIS — I10 Essential (primary) hypertension: Secondary | ICD-10-CM

## 2015-03-26 DIAGNOSIS — R740 Nonspecific elevation of levels of transaminase and lactic acid dehydrogenase [LDH]: Secondary | ICD-10-CM | POA: Diagnosis not present

## 2015-03-26 DIAGNOSIS — F1910 Other psychoactive substance abuse, uncomplicated: Secondary | ICD-10-CM

## 2015-03-26 DIAGNOSIS — A09 Infectious gastroenteritis and colitis, unspecified: Secondary | ICD-10-CM | POA: Diagnosis not present

## 2015-03-26 LAB — BASIC METABOLIC PANEL
Anion gap: 9 (ref 5–15)
CALCIUM: 8.1 mg/dL — AB (ref 8.9–10.3)
CO2: 25 mmol/L (ref 22–32)
CREATININE: 0.42 mg/dL — AB (ref 0.44–1.00)
Chloride: 102 mmol/L (ref 101–111)
GFR calc non Af Amer: >60 mL/min (ref >60)
GLUCOSE: 118 mg/dL — AB (ref 65–99)
Potassium: 2.9 mmol/L — ABNORMAL LOW (ref 3.5–5.1)
Sodium: 136 mmol/L (ref 135–145)

## 2015-03-26 MED ORDER — ESCITALOPRAM OXALATE 20 MG PO TABS
20.0000 mg | ORAL_TABLET | Freq: Every day | ORAL | Status: DC
  Administered 2015-03-26 – 2015-03-28 (×3): 20 mg via ORAL
  Filled 2015-03-26 (×3): qty 1

## 2015-03-26 MED ORDER — POTASSIUM CHLORIDE CRYS ER 20 MEQ PO TBCR
40.0000 meq | EXTENDED_RELEASE_TABLET | Freq: Two times a day (BID) | ORAL | Status: DC
  Administered 2015-03-26 – 2015-03-28 (×5): 40 meq via ORAL
  Filled 2015-03-26 (×5): qty 2

## 2015-03-26 NOTE — Care Management Note (Signed)
Case Management Note  Patient Details  Name: Stephanie Montes MRN: [id number] Date of Birth: [birth year]  Subjective/Objective:      Date: 03/26/15 Spoke with patient . Introduced self as Tourist information centre manager and explained role in discharge planning and how to be reached. Verified patient lives in town, alone. Expressed potential need for no other DME.  Verified patient anticipates to go home  at time of discharge and will have  part-time supervision by mom at this time to best of their knowledge. Patient denied needing help with their medication. Patient drives or is driven by mom to MD appointments.  Verified patient has PCP.   Plan: CM will continue to follow for discharge planning and [facility] resources.               Action/Plan:   Expected Discharge Date:                  Expected Discharge Plan:  Home/Self Care  In-House Referral:     Discharge planning Services  CM Consult  Post Acute Care Choice:    Choice offered to:     DME Arranged:    DME Agency:     HH Arranged:    HH Agency:     Status of Service:  In process, will continue to follow  Medicare Important Message Given:    Date Medicare IM Given:    Medicare IM give by:    Date Additional Medicare IM Given:    Additional Medicare Important Message give by:     If discussed at [place] of Stay Meetings, dates discussed:    Additional Comments:  Stephanie Mayo, RN 03/26/2015, 5:16 PM
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
## 2015-03-27 DIAGNOSIS — A09 Infectious gastroenteritis and colitis, unspecified: Secondary | ICD-10-CM | POA: Diagnosis not present

## 2015-03-27 DIAGNOSIS — F411 Generalized anxiety disorder: Secondary | ICD-10-CM | POA: Diagnosis not present

## 2015-03-27 DIAGNOSIS — E876 Hypokalemia: Secondary | ICD-10-CM | POA: Diagnosis not present

## 2015-03-27 DIAGNOSIS — I10 Essential (primary) hypertension: Secondary | ICD-10-CM | POA: Diagnosis not present

## 2015-03-27 LAB — BASIC METABOLIC PANEL
ANION GAP: 10 (ref 5–15)
BUN: <5 mg/dL — ABNORMAL LOW (ref 6–20)
CO2: 23 mmol/L (ref 22–32)
Calcium: 8.9 mg/dL (ref 8.9–10.3)
Chloride: 105 mmol/L (ref 101–111)
Creatinine, Ser: 0.45 mg/dL (ref 0.44–1.00)
GFR calc non Af Amer: >60 mL/min (ref >60)
GLUCOSE: 119 mg/dL — AB (ref 65–99)
POTASSIUM: 3.9 mmol/L (ref 3.5–5.1)
Sodium: 138 mmol/L (ref 135–145)
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
## 2015-03-28 DIAGNOSIS — R740 Nonspecific elevation of levels of transaminase and lactic acid dehydrogenase [LDH]: Secondary | ICD-10-CM | POA: Diagnosis not present

## 2015-03-28 DIAGNOSIS — F411 Generalized anxiety disorder: Secondary | ICD-10-CM | POA: Diagnosis not present

## 2015-03-28 DIAGNOSIS — R1110 Vomiting, unspecified: Secondary | ICD-10-CM | POA: Diagnosis not present

## 2015-03-28 DIAGNOSIS — E876 Hypokalemia: Secondary | ICD-10-CM | POA: Diagnosis not present

## 2015-03-28 LAB — COMPREHENSIVE METABOLIC PANEL WITH GFR
ALT: 60 U/L — ABNORMAL HIGH (ref 14–54)
AST: 52 U/L — ABNORMAL HIGH (ref 15–41)
Albumin: 3.3 g/dL — ABNORMAL LOW (ref 3.5–5.0)
Alkaline Phosphatase: 142 U/L — ABNORMAL HIGH (ref 38–126)
Anion gap: 6 (ref 5–15)
BUN: <5 mg/dL — ABNORMAL LOW (ref 6–20)
CO2: 23 mmol/L (ref 22–32)
Calcium: 8.7 mg/dL — ABNORMAL LOW (ref 8.9–10.3)
Chloride: 106 mmol/L (ref 101–111)
Creatinine, Ser: 0.45 mg/dL (ref 0.44–1.00)
GFR calc Af Amer: >60 mL/min (ref >60)
GFR calc non Af Amer: >60 mL/min (ref >60)
Glucose, Bld: 108 mg/dL — ABNORMAL HIGH (ref 65–99)
Potassium: 4.1 mmol/L (ref 3.5–5.1)
Sodium: 135 mmol/L (ref 135–145)
Total Bilirubin: 0.6 mg/dL (ref 0.3–1.2)
Total Protein: 6.1 g/dL — ABNORMAL LOW (ref 6.5–8.1)

## 2015-03-28 LAB — HEPATITIS PANEL, ACUTE
HCV Ab: >11 {s_co_ratio} — ABNORMAL HIGH (ref 0.0–0.9)
Hep A IgM: NEGATIVE
Hep B C IgM: NEGATIVE
Hepatitis B Surface Ag: NEGATIVE

## 2015-03-28 LAB — PROTIME-INR
INR: 1.14 (ref 0.00–1.49)
Prothrombin Time: 14.8 seconds (ref 11.6–15.2)

## 2015-03-28 MED ORDER — POTASSIUM CHLORIDE CRYS ER 20 MEQ PO TBCR: 40.0000 meq | EXTENDED_RELEASE_TABLET | Freq: Two times a day (BID) | ORAL | Status: AC

## 2015-03-28 MED ORDER — ZOLPIDEM TARTRATE 5 MG PO TABS: 5.0000 mg | ORAL_TABLET | Freq: Every evening | ORAL | Status: DC | PRN

## 2015-03-28 MED ORDER — PROMETHAZINE HCL 12.5 MG PO TABS: 12.5000 mg | ORAL_TABLET | Freq: Four times a day (QID) | ORAL | Status: DC | PRN

## 2015-03-28 NOTE — Care Management Note (Signed)
Case Management Note  Patient Details  Name: Stephanie Montes MRN: [id number] Date of Birth: [birth year]  Subjective/Objective:     Patient discharged , no needs.               Action/Plan:   Expected Discharge Date:                  Expected Discharge Plan:  Home/Self Care  In-House Referral:     Discharge planning Services  CM Consult  Post Acute Care Choice:    Choice offered to:     DME Arranged:    DME Agency:     HH Arranged:    [place] Agency:     Status of Service:  Completed, signed off  Medicare Important Message Given:    Date Medicare IM Given:    Medicare IM give by:    Date Additional Medicare IM Given:    Additional Medicare Important Message give by:     If discussed at [place] of Stay Meetings, dates discussed:    Additional Comments:  Zenon Mayo, RN 03/28/2015, 11:39 AM
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
## 2015-03-28 NOTE — Progress Notes (Signed)
Patient refuses to take Zofran as prescribed.  Patient states "Zofran does nothing for my stomach"  Phenegran administered to patient for nausea per prn order.  RN will continue to monitor patient .

## 2015-03-28 NOTE — Progress Notes (Signed)
Reviewed discharge summary with pt and prescriptions given.  Pt denied any needs at this time.  PIV removed.  Pt taken to discharge location via wheelchair.

## 2015-03-28 NOTE — Progress Notes (Signed)
   03/28/15 1046  Clinical Encounter Type  Visited With Patient and family together;Health care provider  Visit Type Initial  Referral From Patient;Family  Spiritual Encounters  Spiritual Needs Literature   Chaplain responded to a request to get an advanced directive completed. Chaplain facilitated with notary and volunteers to complete the form. Our support is available as needed.   Jeri Lager, Chaplain 03/28/2015 10:47 AM

## 2015-03-31 LAB — HEPATITIS C GENOTYPE: HEPATITIS C GENOTYPE: 3

## 2015-03-31 LAB — HCV RNA QUANT RFLX ULTRA OR GENOTYP
HCV RNA Qnt(log copy/mL): 6.548 log10 IU/mL
HepC Qn: 3530000 IU/mL

## 2015-06-24 ENCOUNTER — Other Ambulatory Visit: Payer: Self-pay | Admitting: Family

## 2015-08-10 ENCOUNTER — Emergency Department (HOSPITAL_BASED_OUTPATIENT_CLINIC_OR_DEPARTMENT_OTHER): Payer: BLUE CROSS/BLUE SHIELD

## 2015-08-10 ENCOUNTER — Encounter (HOSPITAL_BASED_OUTPATIENT_CLINIC_OR_DEPARTMENT_OTHER): Payer: Self-pay | Admitting: Emergency Medicine

## 2015-08-10 ENCOUNTER — Emergency Department (HOSPITAL_BASED_OUTPATIENT_CLINIC_OR_DEPARTMENT_OTHER)
Admission: EM | Admit: 2015-08-10 | Discharge: 2015-08-10 | Disposition: A | Discharge status: Home / Self Care | Payer: BLUE CROSS/BLUE SHIELD | Attending: Emergency Medicine | Admitting: Emergency Medicine

## 2015-08-10 DIAGNOSIS — Y9389 Activity, other specified: Secondary | ICD-10-CM | POA: Insufficient documentation

## 2015-08-10 DIAGNOSIS — Y9289 Other specified places as the place of occurrence of the external cause: Secondary | ICD-10-CM | POA: Diagnosis not present

## 2015-08-10 DIAGNOSIS — S6991XA Unspecified injury of right wrist, hand and finger(s), initial encounter: Secondary | ICD-10-CM | POA: Diagnosis present

## 2015-08-10 DIAGNOSIS — I10 Essential (primary) hypertension: Secondary | ICD-10-CM | POA: Diagnosis not present

## 2015-08-10 DIAGNOSIS — W19XXXA Unspecified fall, initial encounter: Secondary | ICD-10-CM

## 2015-08-10 DIAGNOSIS — Z79899 Other long term (current) drug therapy: Secondary | ICD-10-CM | POA: Diagnosis not present

## 2015-08-10 DIAGNOSIS — Y998 Other external cause status: Secondary | ICD-10-CM | POA: Insufficient documentation

## 2015-08-10 DIAGNOSIS — F419 Anxiety disorder, unspecified: Secondary | ICD-10-CM | POA: Insufficient documentation

## 2015-08-10 DIAGNOSIS — Z8742 Personal history of other diseases of the female genital tract: Secondary | ICD-10-CM | POA: Diagnosis not present

## 2015-08-10 DIAGNOSIS — W108XXA Fall (on) (from) other stairs and steps, initial encounter: Secondary | ICD-10-CM | POA: Insufficient documentation

## 2015-08-10 DIAGNOSIS — S3992XA Unspecified injury of lower back, initial encounter: Secondary | ICD-10-CM | POA: Insufficient documentation

## 2015-08-10 DIAGNOSIS — S60221A Contusion of right hand, initial encounter: Secondary | ICD-10-CM

## 2015-08-10 MED ORDER — OXYCODONE-ACETAMINOPHEN 5-325 MG PO TABS
1.0000 | ORAL_TABLET | Freq: Once | ORAL | Status: AC
  Administered 2015-08-10: 1 via ORAL
  Filled 2015-08-10: qty 1

## 2015-08-10 MED ORDER — NAPROXEN 500 MG PO TABS: 500.0000 mg | ORAL_TABLET | Freq: Two times a day (BID) | ORAL | Status: DC

## 2015-08-10 NOTE — ED Notes (Signed)
Patient states that she fell and has a box full of books landed on her right hand. Patient is having a hard time moving her hand
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
## 2015-08-10 NOTE — Discharge Instructions (Signed)
Return to the ED with any concerns including increased pain, swelling/numbness/discoloration of fingers or hand, or any other alarming symptoms

## 2016-05-21 ENCOUNTER — Emergency Department (HOSPITAL_BASED_OUTPATIENT_CLINIC_OR_DEPARTMENT_OTHER)
Admission: EM | Admit: 2016-05-21 | Discharge: 2016-05-21 | Disposition: A | Discharge status: Home / Self Care | Payer: BLUE CROSS/BLUE SHIELD | Attending: Emergency Medicine | Admitting: Emergency Medicine

## 2016-05-21 ENCOUNTER — Encounter (HOSPITAL_BASED_OUTPATIENT_CLINIC_OR_DEPARTMENT_OTHER): Payer: Self-pay | Admitting: *Deleted

## 2016-05-21 DIAGNOSIS — I10 Essential (primary) hypertension: Secondary | ICD-10-CM | POA: Insufficient documentation

## 2016-05-21 DIAGNOSIS — K029 Dental caries, unspecified: Secondary | ICD-10-CM | POA: Diagnosis not present

## 2016-05-21 DIAGNOSIS — Z79899 Other long term (current) drug therapy: Secondary | ICD-10-CM | POA: Diagnosis not present

## 2016-05-21 DIAGNOSIS — J329 Chronic sinusitis, unspecified: Secondary | ICD-10-CM

## 2016-05-21 DIAGNOSIS — H9203 Otalgia, bilateral: Secondary | ICD-10-CM | POA: Diagnosis present

## 2016-05-21 MED ORDER — DICLOFENAC SODIUM 75 MG PO TBEC
75.0000 mg | DELAYED_RELEASE_TABLET | Freq: Two times a day (BID) | ORAL | 0 refills | Status: DC
Start: 2016-05-21 — End: 2017-01-19

## 2016-05-21 MED ORDER — AMOXICILLIN 500 MG PO CAPS: 500.0000 mg | ORAL_CAPSULE | Freq: Three times a day (TID) | ORAL | 0 refills | Status: DC

## 2016-05-21 NOTE — ED Triage Notes (Signed)
Pt c/o URi symptoms x 3 days  

## 2016-05-21 NOTE — Discharge Instructions (Signed)
See your Physician for recheck if symptoms persist  
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
## 2016-11-26 ENCOUNTER — Emergency Department (HOSPITAL_BASED_OUTPATIENT_CLINIC_OR_DEPARTMENT_OTHER)
Admission: EM | Admit: 2016-11-26 | Discharge: 2016-11-26 | Disposition: A | Discharge status: Home / Self Care | Payer: BLUE CROSS/BLUE SHIELD | Attending: Emergency Medicine | Admitting: Emergency Medicine

## 2016-11-26 ENCOUNTER — Encounter (HOSPITAL_BASED_OUTPATIENT_CLINIC_OR_DEPARTMENT_OTHER): Payer: Self-pay | Admitting: *Deleted

## 2016-11-26 DIAGNOSIS — I10 Essential (primary) hypertension: Secondary | ICD-10-CM | POA: Diagnosis not present

## 2016-11-26 DIAGNOSIS — K0889 Other specified disorders of teeth and supporting structures: Secondary | ICD-10-CM | POA: Diagnosis present

## 2016-11-26 DIAGNOSIS — K029 Dental caries, unspecified: Secondary | ICD-10-CM | POA: Diagnosis not present

## 2016-11-26 DIAGNOSIS — Z79899 Other long term (current) drug therapy: Secondary | ICD-10-CM | POA: Insufficient documentation

## 2016-11-26 MED ORDER — PENICILLIN V POTASSIUM 500 MG PO TABS: 500.0000 mg | ORAL_TABLET | Freq: Four times a day (QID) | ORAL | 0 refills | Status: AC

## 2016-11-26 MED ORDER — KETOROLAC TROMETHAMINE 60 MG/2ML IM SOLN: 60.0000 mg | Freq: Once | INTRAMUSCULAR | Status: DC

## 2016-11-26 MED ORDER — OXYCODONE-ACETAMINOPHEN 5-325 MG PO TABS
2.0000 | ORAL_TABLET | Freq: Once | ORAL | Status: AC
  Administered 2016-11-26: 2 via ORAL
  Filled 2016-11-26: qty 2

## 2016-11-26 MED ORDER — OXYCODONE-ACETAMINOPHEN 5-325 MG PO TABS: 2.0000 | ORAL_TABLET | Freq: Once | ORAL | Status: DC

## 2016-11-26 MED ORDER — NAPROXEN 500 MG PO TABS: 500.0000 mg | ORAL_TABLET | Freq: Two times a day (BID) | ORAL | 0 refills | Status: DC | PRN
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
## 2016-11-26 NOTE — ED Notes (Signed)
Pt states she has to call for a ride

## 2016-11-26 NOTE — ED Notes (Addendum)
Pt is shaking and saying "I'm in a lot of pain" upon EMT's entrance into the room. After vitals were taken pt stopped shaking. RN notified.

## 2016-11-26 NOTE — ED Triage Notes (Signed)
Dental pain for 2 weeks.

## 2016-11-26 NOTE — ED Notes (Signed)
C/o dental pain to rt loser, left lower and left upper mouth x 1 week

## 2017-01-19 ENCOUNTER — Emergency Department (HOSPITAL_BASED_OUTPATIENT_CLINIC_OR_DEPARTMENT_OTHER): Payer: BLUE CROSS/BLUE SHIELD

## 2017-01-19 ENCOUNTER — Emergency Department (HOSPITAL_BASED_OUTPATIENT_CLINIC_OR_DEPARTMENT_OTHER)
Admission: EM | Admit: 2017-01-19 | Discharge: 2017-01-20 | Disposition: A | Discharge status: Home / Self Care | Payer: BLUE CROSS/BLUE SHIELD | Attending: Emergency Medicine | Admitting: Emergency Medicine

## 2017-01-19 ENCOUNTER — Encounter (HOSPITAL_BASED_OUTPATIENT_CLINIC_OR_DEPARTMENT_OTHER): Payer: Self-pay

## 2017-01-19 DIAGNOSIS — I10 Essential (primary) hypertension: Secondary | ICD-10-CM | POA: Diagnosis not present

## 2017-01-19 DIAGNOSIS — F419 Anxiety disorder, unspecified: Secondary | ICD-10-CM | POA: Diagnosis not present

## 2017-01-19 DIAGNOSIS — F329 Major depressive disorder, single episode, unspecified: Secondary | ICD-10-CM | POA: Diagnosis not present

## 2017-01-19 DIAGNOSIS — F418 Other specified anxiety disorders: Secondary | ICD-10-CM

## 2017-01-19 DIAGNOSIS — F17200 Nicotine dependence, unspecified, uncomplicated: Secondary | ICD-10-CM | POA: Insufficient documentation

## 2017-01-19 DIAGNOSIS — R4182 Altered mental status, unspecified: Secondary | ICD-10-CM | POA: Diagnosis present

## 2017-01-19 NOTE — ED Notes (Signed)
Spoke with the patient alone and I asked her as to why she don't want her sister with her in the room.  Patient stated that she don't want no drug test because she took marijuana 4 months ago.  I asked her if that is all she took.  She stated that she took her Xanax this morning.    While I was talking to the patient, I asked the sister, Abigail Butts, to step out for a minute and she did.

## 2017-01-19 NOTE — ED Notes (Signed)
Discussed case with EDP [place]-no orders received

## 2017-01-19 NOTE — ED Notes (Signed)
ED Provider at bedside. 
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
## 2017-01-19 NOTE — ED Triage Notes (Addendum)
C/o "feeling confused-like I can't get my words out", HA, decreased grip to right hand since 8/18-states she tripped and hit left forehead on "cushioned couch" 8/18-sx started after-also c/o pain to multiple teeth-pt NAD-pt is jittery and moving around in chair-steady gait

## 2017-01-19 NOTE — ED Notes (Signed)
Pt approached this RN and asked for her sister Cariana Karge to not be allowed in room with pt.

## 2017-01-20 LAB — BASIC METABOLIC PANEL
ANION GAP: 7 (ref 5–15)
BUN: 7 mg/dL (ref 6–20)
CALCIUM: 8.7 mg/dL — AB (ref 8.9–10.3)
CHLORIDE: 103 mmol/L (ref 101–111)
CO2: 25 mmol/L (ref 22–32)
Creatinine, Ser: 0.51 mg/dL (ref 0.44–1.00)
GFR calc non Af Amer: >60 mL/min (ref >60)
Glucose, Bld: 79 mg/dL (ref 65–99)
Potassium: 3.9 mmol/L (ref 3.5–5.1)
SODIUM: 135 mmol/L (ref 135–145)

## 2017-01-20 LAB — CBC WITH DIFFERENTIAL/PLATELET
BASOS ABS: 0 10*3/uL (ref 0.0–0.1)
BASOS PCT: 0 %
EOS ABS: 0.6 10*3/uL (ref 0.0–0.7)
Eosinophils Relative: 6 %
HEMATOCRIT: 35 % — AB (ref 36.0–46.0)
HEMOGLOBIN: 11.8 g/dL — AB (ref 12.0–15.0)
Lymphocytes Relative: 33 %
Lymphs Abs: 2.9 10*3/uL (ref 0.7–4.0)
MCH: 30 pg (ref 26.0–34.0)
MCHC: 33.7 g/dL (ref 30.0–36.0)
MCV: 89.1 fL (ref 78.0–100.0)
Monocytes Absolute: 0.7 10*3/uL (ref 0.1–1.0)
Monocytes Relative: 8 %
NEUTROS ABS: 4.6 10*3/uL (ref 1.7–7.7)
NEUTROS PCT: 53 %
Platelets: 296 10*3/uL (ref 150–400)
RBC: 3.93 MIL/uL (ref 3.87–5.11)
RDW: 15 % (ref 11.5–15.5)
WBC: 8.8 10*3/uL (ref 4.0–10.5)

## 2017-01-20 LAB — RAPID URINE DRUG SCREEN, HOSP PERFORMED
Amphetamines: NOT DETECTED
BARBITURATES: NOT DETECTED
BENZODIAZEPINES: POSITIVE — AB
COCAINE: NOT DETECTED
OPIATES: NOT DETECTED
Tetrahydrocannabinol: NOT DETECTED

## 2017-01-20 LAB — PREGNANCY, URINE: Preg Test, Ur: NEGATIVE

## 2017-01-20 LAB — ETHANOL: Alcohol, Ethyl (B): <5 mg/dL (ref <5)

## 2017-01-20 NOTE — ED Notes (Signed)
Patient also stated that she went to the dentist and dentist pulled her tooth 2 days ago.  She pointed to her tooth that half of the bottom tooth is still on and is decayed.  Sister was at bedside and saw the decayed teeth.  Patient also stated that she has not been taking care of her teeth.

## 2017-10-16 ENCOUNTER — Emergency Department (HOSPITAL_COMMUNITY)
Admission: EM | Admit: 2017-10-16 | Discharge: 2017-10-16 | Disposition: A | Discharge status: Home / Self Care | Payer: Self-pay | Attending: Emergency Medicine | Admitting: Emergency Medicine

## 2017-10-16 ENCOUNTER — Other Ambulatory Visit: Payer: Self-pay

## 2017-10-16 ENCOUNTER — Emergency Department (HOSPITAL_COMMUNITY): Payer: Self-pay

## 2017-10-16 DIAGNOSIS — S40012A Contusion of left shoulder, initial encounter: Secondary | ICD-10-CM | POA: Insufficient documentation

## 2017-10-16 DIAGNOSIS — Y939 Activity, unspecified: Secondary | ICD-10-CM | POA: Insufficient documentation

## 2017-10-16 DIAGNOSIS — W010XXA Fall on same level from slipping, tripping and stumbling without subsequent striking against object, initial encounter: Secondary | ICD-10-CM | POA: Insufficient documentation

## 2017-10-16 DIAGNOSIS — I10 Essential (primary) hypertension: Secondary | ICD-10-CM | POA: Insufficient documentation

## 2017-10-16 DIAGNOSIS — Y929 Unspecified place or not applicable: Secondary | ICD-10-CM | POA: Insufficient documentation

## 2017-10-16 DIAGNOSIS — F17210 Nicotine dependence, cigarettes, uncomplicated: Secondary | ICD-10-CM | POA: Insufficient documentation

## 2017-10-16 DIAGNOSIS — M25512 Pain in left shoulder: Secondary | ICD-10-CM

## 2017-10-16 DIAGNOSIS — Y999 Unspecified external cause status: Secondary | ICD-10-CM | POA: Insufficient documentation

## 2017-10-16 DIAGNOSIS — Z79899 Other long term (current) drug therapy: Secondary | ICD-10-CM | POA: Insufficient documentation

## 2017-10-16 NOTE — Discharge Instructions (Signed)
Wear shoulder sling for no more than 3 days, then begin performing gentle range of motion exercises. Ice your shoulder throughout the day, using an ice pack for 20 minutes at a time every hour. Alternate between tylenol and ibuprofen as needed for pain relief. Call orthopedic follow up today or tomorrow to schedule followup appointment for recheck of ongoing shoulder pain in 1-2 weeks. Return to the ER for changes or worsening symptoms.
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
## 2017-12-16 ENCOUNTER — Encounter (HOSPITAL_COMMUNITY): Payer: Self-pay

## 2017-12-16 ENCOUNTER — Other Ambulatory Visit: Payer: Self-pay

## 2017-12-16 ENCOUNTER — Emergency Department (HOSPITAL_COMMUNITY)
Admission: EM | Admit: 2017-12-16 | Discharge: 2017-12-16 | Disposition: A | Discharge status: Home / Self Care | Payer: Self-pay | Attending: Emergency Medicine | Admitting: Emergency Medicine

## 2017-12-16 DIAGNOSIS — K0889 Other specified disorders of teeth and supporting structures: Secondary | ICD-10-CM | POA: Insufficient documentation

## 2017-12-16 DIAGNOSIS — Z87891 Personal history of nicotine dependence: Secondary | ICD-10-CM | POA: Insufficient documentation

## 2017-12-16 DIAGNOSIS — K029 Dental caries, unspecified: Secondary | ICD-10-CM | POA: Insufficient documentation

## 2017-12-16 DIAGNOSIS — Z79899 Other long term (current) drug therapy: Secondary | ICD-10-CM | POA: Insufficient documentation

## 2017-12-16 MED ORDER — TRAMADOL HCL 50 MG PO TABS
50.0000 mg | ORAL_TABLET | Freq: Three times a day (TID) | ORAL | 0 refills | Status: AC | PRN
Start: 2017-12-16 — End: ?

## 2017-12-16 MED ORDER — CHLORHEXIDINE GLUCONATE 0.12 % MT SOLN: 15.0000 mL | Freq: Two times a day (BID) | OROMUCOSAL | 0 refills | Status: AC

## 2017-12-16 MED ORDER — IBUPROFEN 400 MG PO TABS: 400.0000 mg | ORAL_TABLET | Freq: Four times a day (QID) | ORAL | 0 refills | Status: AC | PRN

## 2017-12-16 MED ORDER — PENICILLIN V POTASSIUM 500 MG PO TABS: 500.0000 mg | ORAL_TABLET | Freq: Four times a day (QID) | ORAL | 0 refills | Status: AC

## 2017-12-16 NOTE — ED Triage Notes (Signed)
Patient c/o left upper and lower dental pain and slight swelling x 3 days. Patient states the pain is in her left ear as well.
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
## 2017-12-16 NOTE — Discharge Instructions (Addendum)
Please call your primary care provider as soon as you can to follow-up regarding your visit today. Please return to the emergency department for any new or worsening symptoms or if your symptoms do not improve. 3.   Please go to the dental appointment that you have told me you have scheduled for next Thursday for further evaluation and treatment. 4.   Please take your antibiotic medication, penicillin, as prescribed. 5.   You may use ibuprofen as prescribed for pain, please also use the antiseptic mouthwash I have prescribed. 6.   Also been prescribed tramadol for severe pain, please only use as prescribed.  Please do not drive or operate heavy machinery while taking tramadol. 7.   Your blood pressure was noted to be elevated today, please follow-up with your primary care provider for further evaluation.  Contact a doctor if: Your pain is not helped with medicines. Your symptoms are worse. You have new symptoms. Get help right away if: You cannot open your mouth. You are having trouble breathing or swallowing. You have a fever. Your face, neck, or jaw is puffy (swollen).

## 2017-12-16 NOTE — ED Notes (Signed)
AVS explained in detail. Knows to take antibiotic until completion and with food. Knows to utilize mouthwash BID. Advised not to drive with Tramadol because she is unsure if she has taken this medication before. Given specific instructions on dental care and pain management. Given resources to follow up with primary care or to find a primary care physician. A&Ox4. Ambulatory with steady gait, RR clear/unlabored. Afebrile at discharge. No other c/c. Acknowledges understanding of AVS.

## 2018-07-01 IMAGING — CT CT HEAD W/O CM
3 series · 14 of 46 positions shown, 16 images · non-contrast
Comparison: Head CT 10/06/2006

CLINICAL DATA: Right hand weakness and altered mental status.

EXAM:
CT HEAD WITHOUT CONTRAST
TECHNIQUE: Contiguous axial images were obtained from the base of the skull
through the vertex without intravenous contrast.

[Series 2: head wo · axial · 0.45mm/px · z∈[-134,-14]mm · 8 of 29 slices shown, 10 images]
[im 3/29  brain]
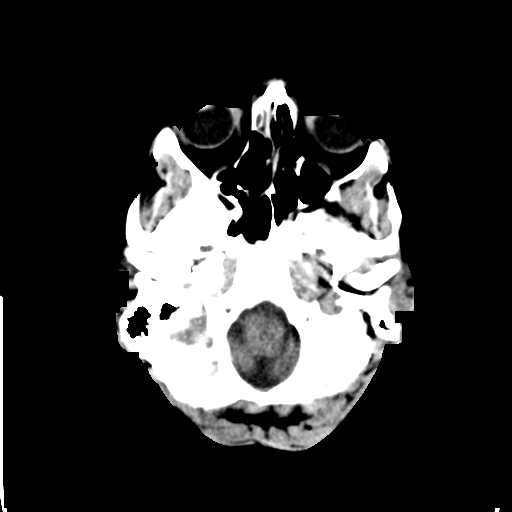
[im 3/29  bone]
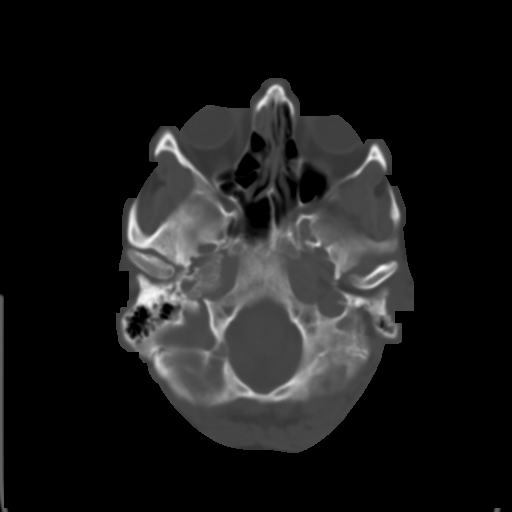
[im 7/29  brain]
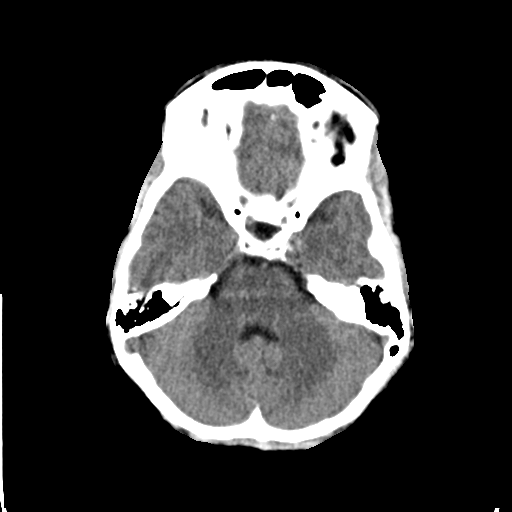
[im 10/29  brain]
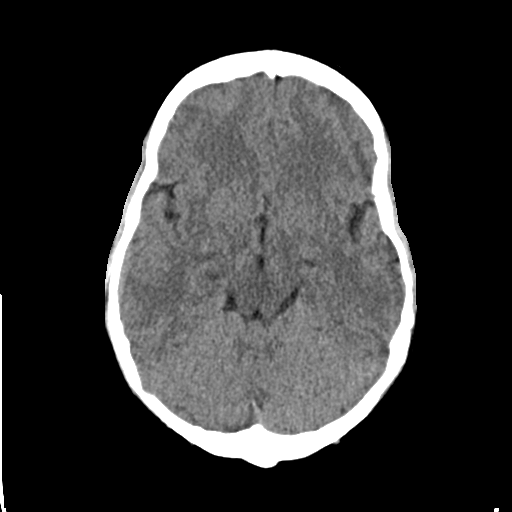
[im 13/29  brain]
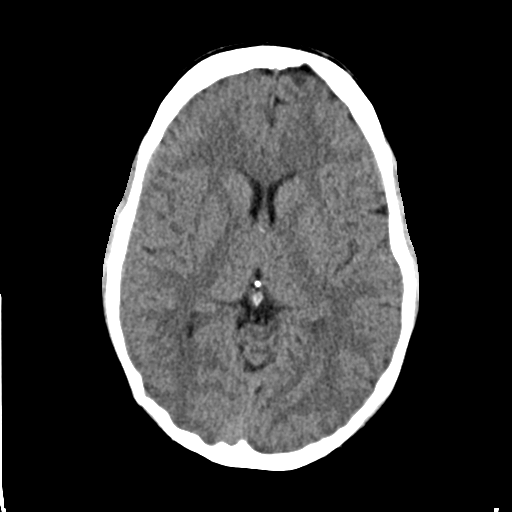
[im 17/29  brain]
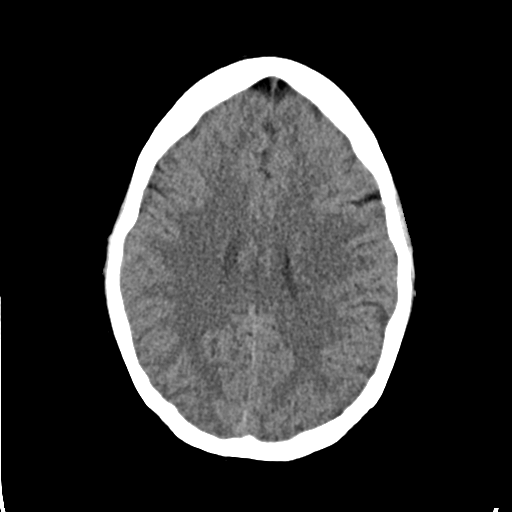
[im 17/29  bone]
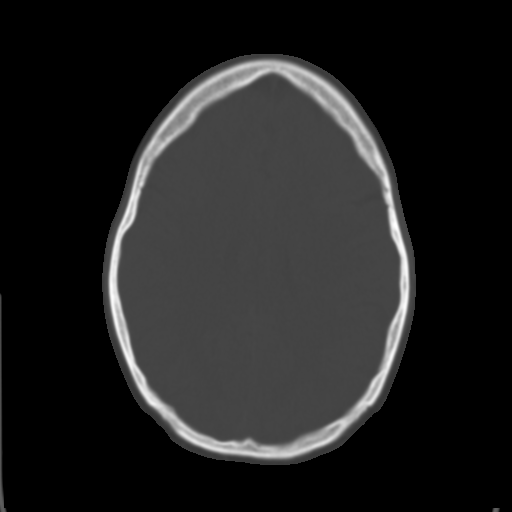
[im 20/29  brain]
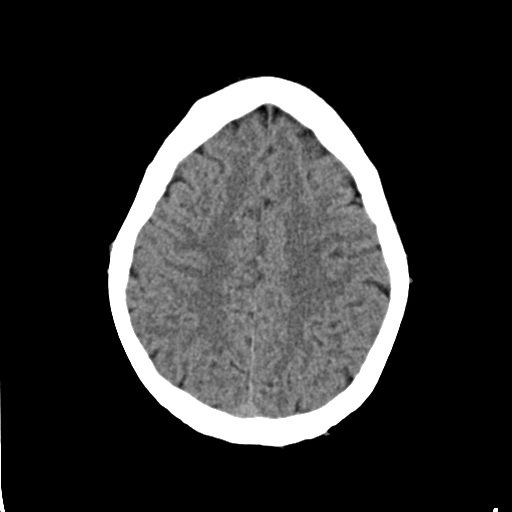
[im 23/29  brain]
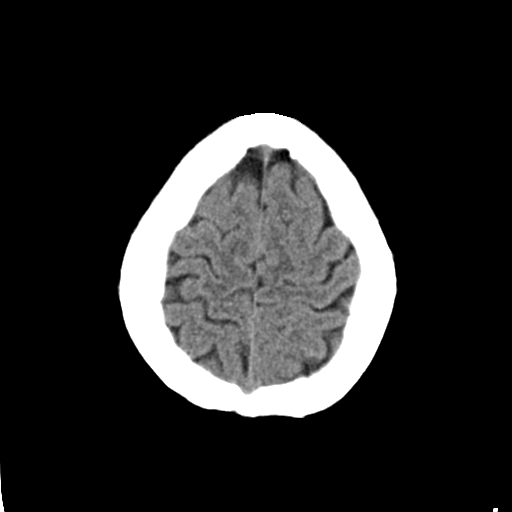
[im 27/29  brain]
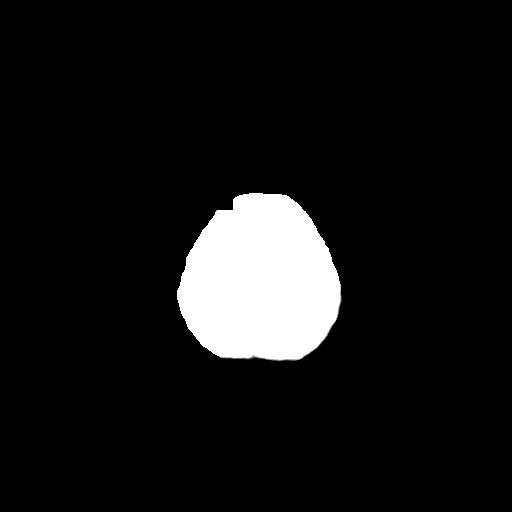

[Series 5: coronal soft · coronal · 0.29mm/px · 3 of 65 slices shown]
[im 22/65  brain]
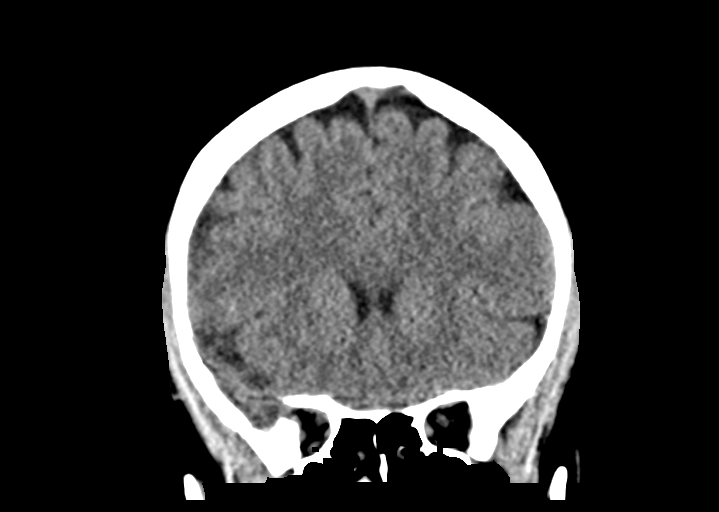
[im 29/65  brain]
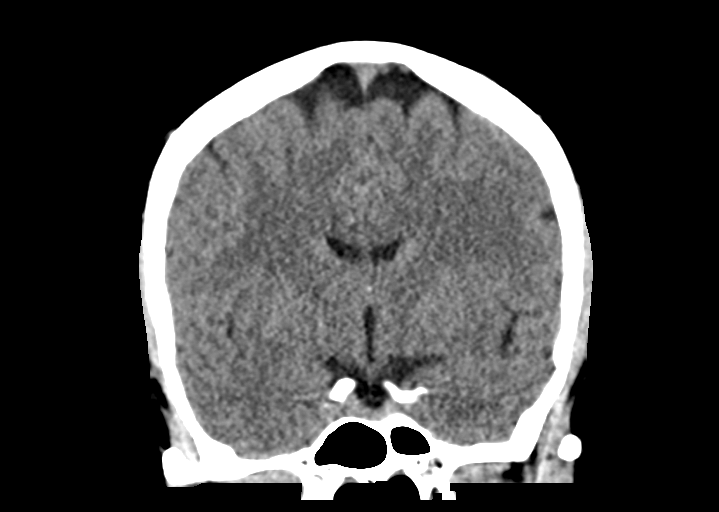
[im 36/65  brain]
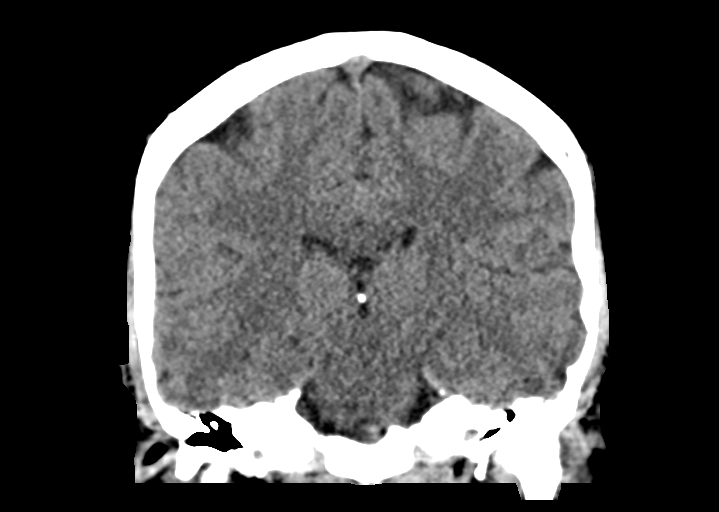

[Series 6: sag soft · sagittal · 0.30mm/px · 3 of 50 slices shown]
[im 17/50  brain]
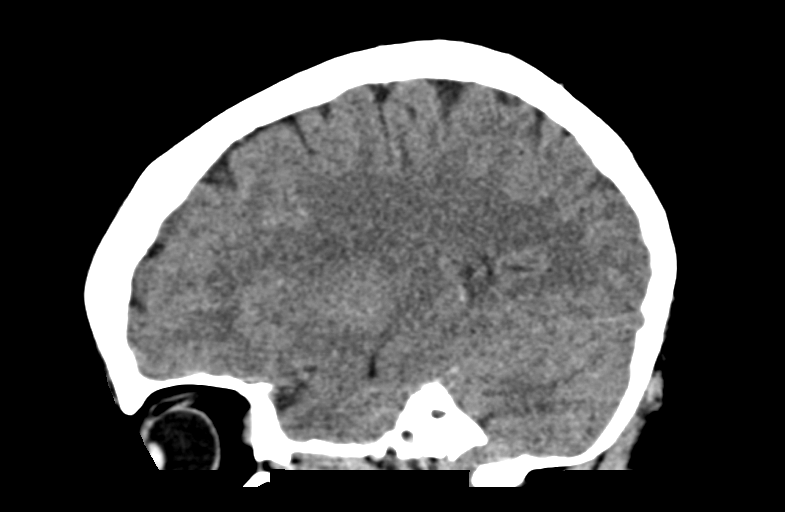
[im 25/50  brain]
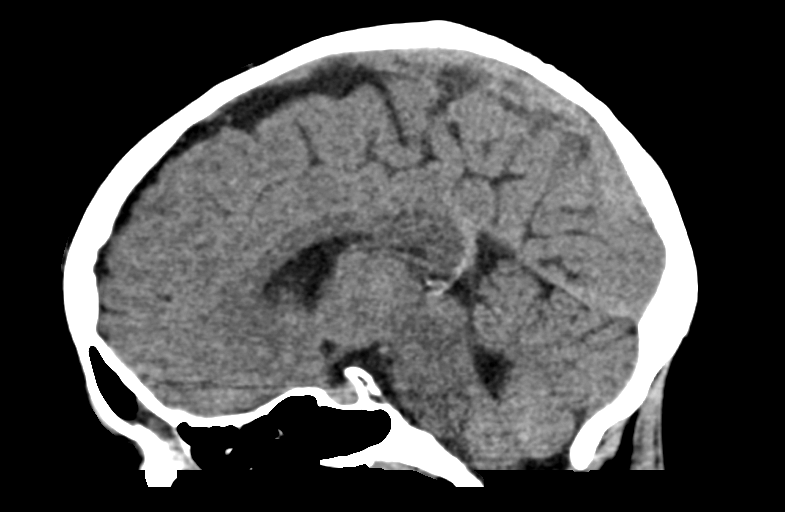
[im 33/50  brain]
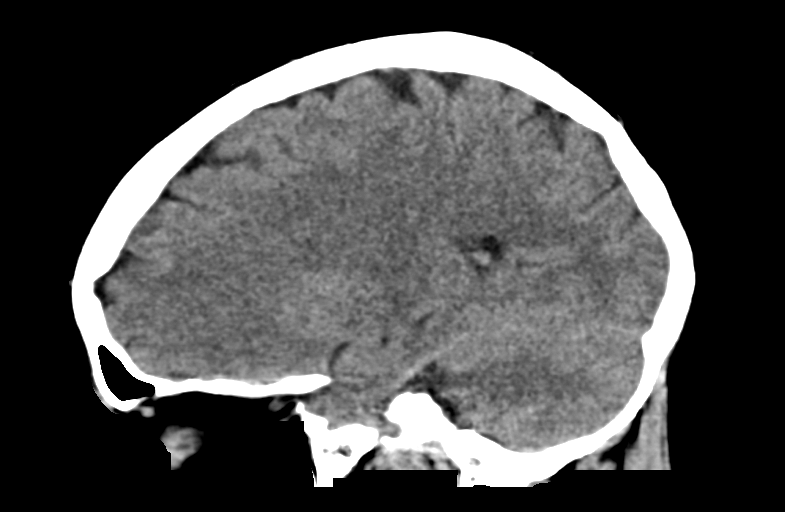

[14 of 46 positions shown; findings below may reference images not displayed]

FINDINGS: Brain: No mass lesion, intraparenchymal hemorrhage or extra-axial
collection. No evidence of acute cortical infarct. Brain parenchyma
and CSF-containing spaces are normal for age.

Vascular: No hyperdense vessel or unexpected calcification.

Skull: Normal visualized skull base, calvarium and extracranial soft
tissues.

Sinuses/Orbits: No sinus fluid levels or advanced mucosal
thickening. No mastoid effusion. Normal orbits.
IMPRESSION: Normal head CT.

## 2019-02-19 ENCOUNTER — Other Ambulatory Visit: Payer: Self-pay

## 2019-02-19 ENCOUNTER — Encounter (HOSPITAL_COMMUNITY): Payer: Self-pay

## 2019-02-19 ENCOUNTER — Emergency Department (HOSPITAL_COMMUNITY)
Admission: EM | Admit: 2019-02-19 | Discharge: 2019-02-19 | Disposition: A | Discharge status: Home / Self Care | Payer: Self-pay | Attending: Emergency Medicine | Admitting: Emergency Medicine

## 2019-02-19 DIAGNOSIS — E039 Hypothyroidism, unspecified: Secondary | ICD-10-CM | POA: Insufficient documentation

## 2019-02-19 DIAGNOSIS — Z79899 Other long term (current) drug therapy: Secondary | ICD-10-CM | POA: Insufficient documentation

## 2019-02-19 DIAGNOSIS — T50901A Poisoning by unspecified drugs, medicaments and biological substances, accidental (unintentional), initial encounter: Secondary | ICD-10-CM | POA: Insufficient documentation

## 2019-02-19 DIAGNOSIS — T401X1A Poisoning by heroin, accidental (unintentional), initial encounter: Secondary | ICD-10-CM | POA: Insufficient documentation

## 2019-02-19 DIAGNOSIS — Z87891 Personal history of nicotine dependence: Secondary | ICD-10-CM | POA: Insufficient documentation

## 2019-02-19 DIAGNOSIS — I10 Essential (primary) hypertension: Secondary | ICD-10-CM | POA: Insufficient documentation

## 2019-02-19 LAB — CBG MONITORING, ED: Glucose-Capillary: 88 mg/dL (ref 70–99)
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
## 2019-02-19 NOTE — ED Triage Notes (Signed)
EMS reports several calls from bystanders for same Pt sitting next to a building outdoors, Pt initially refused transport, on third call Pt agreed to transport and admitted Heroine use, Pt visibly altered, unknown amount of ingestion. Pt denies pain, is alert and oriented and can communicate.  BP 127/78 HR 96 RR 20 Sp02 97 RA CBG 96 Temp 97.2
